# Patient Record
Sex: Female | Born: 1965 | Race: White | Hispanic: Yes | Marital: Single | Smoking: Never smoker
Health system: Southern US, Community
[De-identification: ages and names within clinical notes are randomized; demographics above are authoritative.]

## PROBLEM LIST (undated history)

## (undated) DIAGNOSIS — E7800 Pure hypercholesterolemia, unspecified: Secondary | ICD-10-CM

## (undated) HISTORY — DX: Pure hypercholesterolemia, unspecified: E78.00

---

## 2004-10-03 ENCOUNTER — Emergency Department (HOSPITAL_COMMUNITY): Admission: EM | Admit: 2004-10-03 | Discharge: 2004-10-03 | Payer: Self-pay | Admitting: Family Medicine

## 2004-11-20 ENCOUNTER — Emergency Department (HOSPITAL_COMMUNITY): Admission: EM | Admit: 2004-11-20 | Discharge: 2004-11-20 | Payer: Self-pay | Admitting: Family Medicine

## 2005-05-17 ENCOUNTER — Emergency Department (HOSPITAL_COMMUNITY): Admission: EM | Admit: 2005-05-17 | Discharge: 2005-05-17 | Payer: Self-pay | Admitting: Emergency Medicine

## 2005-11-17 ENCOUNTER — Emergency Department (HOSPITAL_COMMUNITY): Admission: EM | Admit: 2005-11-17 | Discharge: 2005-11-18 | Payer: Self-pay | Admitting: Emergency Medicine

## 2005-12-17 ENCOUNTER — Emergency Department (HOSPITAL_COMMUNITY): Admission: AD | Admit: 2005-12-17 | Discharge: 2005-12-17 | Payer: Self-pay | Admitting: Family Medicine

## 2005-12-27 ENCOUNTER — Ambulatory Visit (HOSPITAL_COMMUNITY): Admission: RE | Admit: 2005-12-27 | Discharge: 2005-12-27 | Payer: Self-pay | Admitting: Obstetrics & Gynecology

## 2006-06-14 ENCOUNTER — Emergency Department (HOSPITAL_COMMUNITY): Admission: EM | Admit: 2006-06-14 | Discharge: 2006-06-14 | Payer: Self-pay | Admitting: Family Medicine

## 2007-12-04 ENCOUNTER — Other Ambulatory Visit: Admission: RE | Admit: 2007-12-04 | Discharge: 2007-12-04 | Payer: Self-pay | Admitting: Gynecology

## 2009-03-22 ENCOUNTER — Encounter: Payer: Self-pay | Admitting: Gynecology

## 2009-03-22 ENCOUNTER — Other Ambulatory Visit: Admission: RE | Admit: 2009-03-22 | Discharge: 2009-03-22 | Payer: Self-pay | Admitting: Gynecology

## 2009-03-22 ENCOUNTER — Ambulatory Visit: Payer: Self-pay | Admitting: Gynecology

## 2009-03-26 ENCOUNTER — Inpatient Hospital Stay (HOSPITAL_COMMUNITY): Admission: AD | Admit: 2009-03-26 | Discharge: 2009-03-26 | Payer: Self-pay | Admitting: Gynecology

## 2009-06-09 ENCOUNTER — Ambulatory Visit (HOSPITAL_COMMUNITY): Admission: RE | Admit: 2009-06-09 | Discharge: 2009-06-09 | Payer: Self-pay | Admitting: Obstetrics and Gynecology

## 2009-07-08 ENCOUNTER — Emergency Department (HOSPITAL_COMMUNITY): Admission: EM | Admit: 2009-07-08 | Discharge: 2009-07-08 | Payer: Self-pay | Admitting: Family Medicine

## 2010-10-02 ENCOUNTER — Encounter: Payer: Self-pay | Admitting: Obstetrics and Gynecology

## 2010-12-15 LAB — POCT URINALYSIS DIP (DEVICE)
Bilirubin Urine: NEGATIVE
Nitrite: NEGATIVE
Urobilinogen, UA: 1 mg/dL (ref 0.0–1.0)

## 2010-12-18 LAB — WET PREP, GENITAL: Yeast Wet Prep HPF POC: NONE SEEN

## 2010-12-18 LAB — GC/CHLAMYDIA PROBE AMP, GENITAL: GC Probe Amp, Genital: NEGATIVE

## 2011-10-19 ENCOUNTER — Other Ambulatory Visit (HOSPITAL_COMMUNITY)
Admission: RE | Admit: 2011-10-19 | Discharge: 2011-10-19 | Disposition: A | Discharge status: Home / Self Care | Payer: 59 | Source: Ambulatory Visit | Attending: Gynecology | Admitting: Gynecology

## 2011-10-19 ENCOUNTER — Other Ambulatory Visit: Payer: Self-pay | Admitting: Gynecology

## 2011-10-19 ENCOUNTER — Other Ambulatory Visit: Payer: Self-pay | Admitting: *Deleted

## 2011-10-19 ENCOUNTER — Encounter: Payer: Self-pay | Admitting: Gynecology

## 2011-10-19 ENCOUNTER — Ambulatory Visit (INDEPENDENT_AMBULATORY_CARE_PROVIDER_SITE_OTHER): Payer: 59 | Admitting: Gynecology

## 2011-10-19 DIAGNOSIS — N898 Other specified noninflammatory disorders of vagina: Secondary | ICD-10-CM

## 2011-10-19 DIAGNOSIS — M6289 Other specified disorders of muscle: Secondary | ICD-10-CM

## 2011-10-19 DIAGNOSIS — M2550 Pain in unspecified joint: Secondary | ICD-10-CM

## 2011-10-19 DIAGNOSIS — R29898 Other symptoms and signs involving the musculoskeletal system: Secondary | ICD-10-CM

## 2011-10-19 DIAGNOSIS — Z113 Encounter for screening for infections with a predominantly sexual mode of transmission: Secondary | ICD-10-CM

## 2011-10-19 DIAGNOSIS — Z01419 Encounter for gynecological examination (general) (routine) without abnormal findings: Secondary | ICD-10-CM

## 2011-10-19 DIAGNOSIS — R635 Abnormal weight gain: Secondary | ICD-10-CM

## 2011-10-19 LAB — URINALYSIS W MICROSCOPIC + REFLEX CULTURE
Casts: NONE SEEN
Crystals: NONE SEEN
Urobilinogen, UA: 0.2 mg/dL (ref 0.0–1.0)

## 2011-10-19 LAB — LIPID PANEL
HDL: 44 mg/dL (ref >39)
LDL Cholesterol: 129 mg/dL — ABNORMAL HIGH (ref 0–99)
VLDL: 20 mg/dL (ref 0–40)

## 2011-10-19 LAB — WET PREP FOR TRICH, YEAST, CLUE: Clue Cells Wet Prep HPF POC: NONE SEEN

## 2011-10-19 MED ORDER — NITROFURANTOIN MONOHYD MACRO 100 MG PO CAPS: 100.0000 mg | ORAL_CAPSULE | Freq: Two times a day (BID) | ORAL | Status: AC

## 2011-10-19 NOTE — Progress Notes (Signed)
Sharon Blair 05/05/1966 [id number]   History:    46 y.o.  for annual exam with request also to have an STD screen since she has a new partner. She thought she was having a slight vaginal discharge. Has complained of weight gain. And also musculoskeletal fatigue. Patient had a Mirena IUD placed after the birth of her last child in 2011.  Past medical history,surgical history, family history and social history were all reviewed and documented in the EPIC chart.  Gynecologic History No LMP recorded. Patient is not currently having periods (Reason: IUD). Contraception: IUD Last Pap: 2011. Results were: normal Last mammogram: Several years ago. Results were: normal  Obstetric History OB History    Grav Para Term Preterm Abortions TAB SAB Ect Mult Living   6 4 4  1  1   4      # Outc Date GA Lbr Len/2nd Wgt Sex Del Anes PTL Lv   1 GRA            2 TRM     F SVD  No Yes   3 TRM     M SVD  No Yes   4 TRM     F SVD  No Yes   5 TRM     F SVD  No Yes   6 SAB                ROS:  Was performed and pertinent positives and negatives are included in the history.  Exam: chaperone present  BP 122/78  Ht 4' 11.5" (1.511 m)  Wt 186 lb (84.369 kg)  BMI 36.94 kg/m2  Body mass index is 36.94 kg/(m^2).  General appearance : Well developed well nourished female. No acute distress HEENT: Neck supple, trachea midline, no carotid bruits, no thyroidmegaly Lungs: Clear to auscultation, no rhonchi or wheezes, or rib retractions  Heart: Regular rate and rhythm, no murmurs or gallops Breast:Examined in sitting and supine position were symmetrical in appearance, no palpable masses or tenderness,  no skin retraction, no nipple inversion, no nipple discharge, no skin discoloration, no axillary or supraclavicular lymphadenopathy Abdomen: no palpable masses or tenderness, no rebound or guarding Extremities: no edema or skin discoloration or tenderness  Pelvic:  Bartholin, Urethra, Skene Glands:  Within normal limits             Vagina: No gross lesions or discharge  Cervix: No gross lesions or discharge, IUD string seen  Uterus  anteverted, normal size, shape and consistency, non-tender and mobile  Adnexa  Without masses or tenderness  Anus and perineum  normal   Rectovaginal  normal sphincter tone without palpated masses or tenderness             Hemoccult not done     Assessment/Plan:  46 y.o. female for annual exam with a multitude of complaints today. Her wet prep was essentially negative we did do a GC and Chlamydia culture as part of the STD screen as well as an HIV RPR hepatitis B and C. She was given a requisition to schedule her mammogram. We'll be checking a fasting lipid profile, fasting blood sugar, TSH, urinalysis, CBC, and vitamin D as well as her Pap smear. Will notify with results become available if there is any abnormality. She was encouraged to do her monthly self breast examination. She was instructed take calcium and vitamin D for osteoporosis prevention. And she was encouraged to engage in weightbearing exercises 3 or 4 times a week. Literature information  on weight reduction diet was provided as well as on exercise.    Ok Edwards MD, 12:46 PM 10/19/2011

## 2011-10-19 NOTE — Patient Instructions (Signed)
Control del colesterol  Los niveles de colesterol en el organismo estn determinados significativamente por su dieta. Los niveles de colesterol tambin se relacionan con la enfermedad cardaca. El material que sigue ayuda a explicar esta relacin y a analizar qu puede hacer para mantener su corazn sano. No todo el colesterol es malo. Las lipoprotenas de baja densidad (LDL) forman el colesterol "malo". El colesterol malo puede ocasionar depsitos de grasa que se acumulan en el interior de las arterias. Las lipoprotenas de alta densidad (HDL) es el colesterol "bueno". Ayuda a remover el colesterol LDL "malo" de la sangre. El colesterol es un factor de riesgo muy importante para la enfermedad cardaca. Otros factores de riesgo son la hipertensin arterial, el hbito de fumar, el estrs, la herencia y el peso.   El msculo cardaco obtiene el suministro de sangre a travs de las arterias coronarias. Si su colesterol LDL ("malo") est elevado y el HDL ("bueno") es bajo, tiene un factor de riesgo para que se formen depsitos de grasa en las arterias coronarias (los vasos sanguneos que suministran sangre al corazn). Esto hace que haya menos lugar para que la sangre circule. Sin la suficiente sangre y oxgeno, el msculo cardaco no puede funcionar correctamente, y usted podr sentir dolores en el pecho (angina pectoris). Cuando una arteria coronaria se cierra completamente, una parte del msculo cardaco puede morir (infarto de miocardio).  CONTROL DEL COLESTEROL Cuando el profesional que lo asiste enva la sangre al laboratorio para conocer el nivel de colesterol, puede realizarle tambin un perfil completo de los lpidos. Con esta prueba, se puede determinar la cantidad total de colesterol, as como los niveles de LDL y HDL. Los triglicridos son un tipo de grasa que circula en la sangre y que tambin puede utilizarse para determinar el riesgo de enfermedad  cardaca. En la siguiente tabla se establecen los nmeros ideales: Prueba: Colesterol total  Menos de 200 mg/dl.  Prueba: LDL "colesterol malo"  Menos de 100 mg/dl.   Menos de 70 mg/dl si tiene riesgo muy elevado de sufrir un ataque cardaco o muerte cardaca sbita.  Prueba: HDL "colesterol bueno"  Mujeres: Ms de 50 mg/dl.   Hombres: Ms de 40 mg/dl.  Prueba: Trigliceridos  Menos de 150 mg/dl.    CONTROL DEL COLESTEROL CON DIETA Aunque factores como el ejercicio y el estilo de vida son importantes, la "primera lnea de ataque" es la dieta. Esto se debe a que se sabe que ciertos alimentos hacen subir el colesterol y otros lo bajan. El objetivo debe ser equilibrar los alimentos, de modo que tengan un efecto sobre el colesterol y, an ms importante, reemplazar las grasas saturadas y trans con otros tipos de grasas, como las monoinsaturadas y las poliinsaturadas y cidos grasos omega-3 . En promedio, una persona no debe consumir ms de 15 a 17 g de grasas saturadas por da. Las grasas saturadas y trans se consideran grasas "malas", ya que elevan el colesterol LDL. Las grasas saturadas se encuentran principalmente en productos animales como carne, manteca y crema. Pero esto no significa que usted debe sacrificar todas sus comidas favoritas. Actualmente, como lo muestra el cuadro que figura al final de este documento, hay sustitutos de buen sabor, bajos en grasas y en colesterol, para la mayora de los alimentos que a usted le gusta comer. Elija aquellos alimentos alternativos que sean bajos en grasas o sin grasas. Elija cortes de carne del cuarto trasero o lomo ya que estos cortes son los que tienen menor cantidad de grasa   y colesterol. El pollo (sin piel), el pescado, la carne de ternera, y la pechuga de pavo molida son excelentes opciones. Elimine las carnes grasosas como los hotdogs o el salami. Los mariscos tienen poco o nada de grasas saturadas. Cuando consuma carne magra, carne de aves de  corral, o pescado, hgalo en porciones de 85 gramos (3 onzas). Las grasas trans tambin se llaman "aceites parcialmente hidrogenados". Son aceites manipulados cientficamente de modo que son slidos a temperatura ambiente, tienen una larga vida y mejoran el sabor y la textura de los alimentos a los que se agregan. Las grasas trans se encuentran en la margarina, masitas, crackers y alimentos horneados.  Para hornear y cocinar, el aceite es un excelente sustituto para la mantequilla. Los aceites monoinsaturados tienen un beneficio particular, ya que se cree que disminuyen el colesterol LDL (colesterol malo) y elevan el HDL. Deber evitar los aceites tropicales saturados como el de coco y el de palma.  Recuerde, adems, que puede comer sin restricciones los grupos de alimentos que son naturalmente libres de grasas saturadas y grasas trans, entre los que se incluyen el pescado, las frutas (excepto el aguacate), verduras, frijoles, cereales (cebada, arroz, cuzcuz, trigo) y las pastas (sin salsas con crema)   IDENTIFIQUE LOS ALIMENTOS QUE DISMINUYEN EL COLESTEROL  Pueden disminuir el colesterol las fibras solubles que estn en las frutas, como las manzanas, en los vegetales como el brcoli, las patatas y las zanahorias; en las legumbres como frijoles, guisantes y lentejas; y en los cereales como la cebada. Los alimentos fortificados con fitosteroles tambin pueden disminuir el colesterol. Debe consumir al menos 2 g de estos alimentos a diario para obtener el efecto de disminucin de colesterol.  En el supermercado, lea las etiquetas de los envases para identificar los alimentos bajos en grasas saturadas, libres de grasas trans y bajos en grasas, . Elija quesos que tengan solo de 2 a 3 g de grasa saturada por onza (28,35 g). Use una margarina que no dae el corazn, libre de grasas trans o aceite parcialmente hidrogenado. Al comprar alimentos horneados (galletitas dulces y galletas) evite el aceite parcialmente  hidrogenado. Los panes y bollos debern ser de granos enteros (harina de maz o de avena entera, en lugar de "harina" o "harina enriquecida"). Compre sopas en lata que no sean cremosas, con bajo contenido de sal y sin grasas adicionadas.   TCNICAS DE PREPARACIN DE LOS ALIMENTOS  Nunca fra los alimentos en aceite abundante. Si debe frer, hgalo en poco aceite y removiendo constantemente, porque as se utilizan muy pocas grasas, o utilice un spray antiadherente. Cuando le sea posible, hierva, hornee o ase las carnes y cocine los vegetales al vapor. En vez de aderezar los vegetales con mantequilla o margarina, utilice limn y hierbas, pur de manzanas y canela (para las calabazas y batatas), yogurt y salsa descremados y aderezos para ensaladas bajos en contenido graso.   BAJO EN GRASAS SATURADAS / SUSTITUTOS BAJOS EN GRASA  Carnes / Grasas saturadas (g)  Evite: Bife, corte graso (3 oz/85 g) / 11 g   Elija: Bife, corte magro (3 oz/85 g) / 4 g   Evite: Hamburguesa (3 oz/85 g) / 7 g   Elija:  [place]amburguesa magra (3 oz/85 g) / 5 g   Evite: Jamn (3 oz/85 g) / 6 g   Elija:  Jamn magro (3 oz/85 g) / 2.4 g   Evite: Pollo, con piel (3 oz/85 g), Carne oscura / 4 g   Elija:  Pollo, sin piel (  3 oz/85 g), Carne oscura / 2 g   Evite: Pollo, con piel (3 oz/85 g), Carne magra / 2.5 g   Elija: Pollo, sin piel (3 oz/85 g), Carne magra / 1 g  Lcteos / Grasas saturadas (g)  Evite: Leche entera (1 taza) / 5 g   Elija: Leche con bajo contenido de grasa, 2% (1 taza) / 3 g   Elija: Leche con bajo contenido de grasa, 1% (1 taza) / 1.5 g   Elija: Leche descremada (1 taza) / 0.3 g   Evite: Queso duro (1 oz/28 g) / 6 g   Elija: Queso descremado (1 oz/28 g) / 2-3 g   Evite: Queso cottage, 4% grasa (1 taza)/ 6.5 g   Elija: Queso cottage con bajo contenido de grasa, 1% grasa (1 taza)/ 1.5 g   Evite: Helado (1 taza) / 9 g   Elija: Sorbete (1 taza) / 2.5 g   Elija: Yogurt helado sin contenido de  grasa (1 taza) / 0.3 g   Elija: Barras de fruta congeladas / vestigios   Evite: Crema batida (1 cucharada) / 3.5 g   Elija: Batidos glac sin lcteos (1 cucharada) / 1 g  Condimentos / Grasas saturadas (g)  Evite: Mayonesa (1 cucharada) / 2 g   Elija: Mayonesa con bajo contenido de grasa (1 cucharada) / 1 g   Evite: Manteca (1 cucharada) / 7 g   Elija: Margarina extra light (1 cucharada) / 1 g   Evite: Aceite de coco (1 cucharada) / 11.8 g   Elija: Aceite de oliva (1 cucharada) / 1.8 g   Elija: Aceite de maz (1 cucharada) / 1.7 g   Elija: Aceite de crtamo (1 cucharada) / 1.2 g   Elija: Aceite de girasol (1 cucharada) / 1.4 g   Elija: Aceite de soja (1 cucharada) / 2.4 g   Elija: Aceite de canola (1 cucharada) / 1 g  Document Released: 08/28/2005 Document Revised: 05/10/2011 [employer] Patient Information 2012 [employer]. Ejercicios para perder peso (Exercise to Lose Weight) La actividad fsica y una dieta saludable ayudan a perder peso. El mdico podr sugerirle ejercicios especficos. IDEAS Y CONSEJOS PARA HACER EJERCICIOS  Elija opciones econmicas que disfrute hacer , como caminar, andar en bicicleta o los vdeos para ejercitarse.   Utilice las escaleras en lugar del ascensor.   Camine durante la hora del almuerzo.   Estacione el auto lejos del lugar de trabajo o estudio.   Concurra a un gimnasio o tome clases de gimnasia.   Comience con 5  10 minutos de actividad fsica por da. Ejercite hasta 30 minutos, 4 a 6 das por semana.   Utilice zapatos que tengan un buen soporte y ropas cmodas.   Elongue antes y despus de ejercitar.   Ejercite hasta que aumente la respiracin y el corazn palpite rpido.   Beba agua extra cuando ejercite.   No haga ejercicio hasta lastimarse, sentirse mareado o que le falte mucho el aire.  La actividad fsica puede quemar alrededor de 150 caloras.  Correr 20 cuadras en 15 minutos.   Jugar vley durante 45 a 60  minutos.   Limpiar y encerar el auto durante 45 a 60 minutos.   Jugar ftbol americano de toque.   Caminar 25 cuadras en 35 minutos.   Empujar un cochecito 20 cuadras en 30 minutos.   Jugar baloncesto durante 30 minutos.   Rastrillar hojas secas durante 30 minutos.   Andar en bicicleta 80 cuadras en 30 minutos.     Caminar 30 cuadras en 30 minutos.   Bailar durante 30 minutos.   Quitar la nieve con una pala durante 15 minutos.   Nadar vigorosamente durante 20 minutos.   Subir escaleras durante 15 minutos.   Andar en bicicleta 60 cuadras durante 15 minutos.   Arreglar el jardn entre 30 y 45 minutos.   Saltar a la soga durante 15 minutos.   Limpiar vidrios o pisos durante 45 a 60 minutos.  Document Released: 12/02/2010 Document Revised: 05/10/2011 [employer] Patient Information 2012 [employer]. 

## 2011-10-20 ENCOUNTER — Other Ambulatory Visit: Payer: Self-pay | Admitting: *Deleted

## 2011-10-20 DIAGNOSIS — E559 Vitamin D deficiency, unspecified: Secondary | ICD-10-CM

## 2011-10-20 LAB — CBC WITH DIFFERENTIAL/PLATELET
Eosinophils Relative: 2 % (ref 0–5)
HCT: 42.2 % (ref 36.0–46.0)
Lymphocytes Relative: 33 % (ref 12–46)
MCV: 97 fL (ref 78.0–100.0)
Neutro Abs: 4.1 10*3/uL (ref 1.7–7.7)
Neutrophils Relative %: 60 % (ref 43–77)
RBC: 4.35 MIL/uL (ref 3.87–5.11)
RDW: 13.1 % (ref 11.5–15.5)
WBC: 6.9 10*3/uL (ref 4.0–10.5)

## 2011-10-20 LAB — HEPATITIS C ANTIBODY: HCV Ab: NEGATIVE

## 2011-10-20 LAB — HIV ANTIBODY (ROUTINE TESTING W REFLEX): HIV: NONREACTIVE

## 2011-10-20 MED ORDER — VITAMIN D (ERGOCALCIFEROL) 1.25 MG (50000 UNIT) PO CAPS: 50000.0000 [IU] | ORAL_CAPSULE | ORAL | Status: DC

## 2012-10-06 ENCOUNTER — Encounter (HOSPITAL_COMMUNITY): Payer: Self-pay | Admitting: *Deleted

## 2012-10-06 ENCOUNTER — Other Ambulatory Visit (HOSPITAL_COMMUNITY)
Admission: RE | Admit: 2012-10-06 | Discharge: 2012-10-06 | Disposition: A | Discharge status: Home / Self Care | Payer: 59 | Source: Ambulatory Visit | Attending: Emergency Medicine | Admitting: Emergency Medicine

## 2012-10-06 ENCOUNTER — Emergency Department (HOSPITAL_COMMUNITY)
Admission: EM | Admit: 2012-10-06 | Discharge: 2012-10-06 | Disposition: A | Discharge status: Home / Self Care | Payer: 59 | Source: Home / Self Care

## 2012-10-06 DIAGNOSIS — Z113 Encounter for screening for infections with a predominantly sexual mode of transmission: Secondary | ICD-10-CM | POA: Insufficient documentation

## 2012-10-06 DIAGNOSIS — N760 Acute vaginitis: Secondary | ICD-10-CM | POA: Insufficient documentation

## 2012-10-06 DIAGNOSIS — A499 Bacterial infection, unspecified: Secondary | ICD-10-CM

## 2012-10-06 DIAGNOSIS — R05 Cough: Secondary | ICD-10-CM

## 2012-10-06 DIAGNOSIS — B9689 Other specified bacterial agents as the cause of diseases classified elsewhere: Secondary | ICD-10-CM

## 2012-10-06 DIAGNOSIS — B002 Herpesviral gingivostomatitis and pharyngotonsillitis: Secondary | ICD-10-CM

## 2012-10-06 LAB — POCT PREGNANCY, URINE: Preg Test, Ur: NEGATIVE

## 2012-10-06 MED ORDER — GUAIFENESIN-CODEINE 100-10 MG/5ML PO SYRP: ORAL_SOLUTION | ORAL | Status: DC

## 2012-10-06 MED ORDER — PENCICLOVIR 1 % EX CREA: TOPICAL_CREAM | CUTANEOUS | Status: DC

## 2012-10-06 MED ORDER — METRONIDAZOLE 500 MG PO TABS: 500.0000 mg | ORAL_TABLET | Freq: Two times a day (BID) | ORAL | Status: DC

## 2012-10-06 NOTE — ED Provider Notes (Signed)
History     CSN: [id number]  Arrival date & time 10/06/12  1159   None     Chief Complaint  Patient presents with  . Vaginal Discharge  . Cough  . Oral Swelling    (Consider location/radiation/quality/duration/timing/severity/associated sxs/prior treatment) HPI Comments: 47-year-old Hispanic female who speaks English is complaining of a vaginal discharge with itching for several days. She also states she has a rash at her by inguinal creases. Is also complaining of bumps and dryness to the lips. Is also complaining of dry cough for one week. She denies earache sore throat or fever. She occasionally has shortness of breath with walking.   Past Medical History  Diagnosis Date  . High cholesterol     History reviewed. No pertinent past surgical history.  Family History  Problem Relation Age of Onset  . Hypertension Mother   . Osteoporosis Mother   . Diabetes Paternal Grandfather     History  Substance Use Topics  . Smoking status: Never Smoker   . Smokeless tobacco: Never Used  . Alcohol Use: Yes     Comment: SOCIAL    OB History    Grav Para Term Preterm Abortions TAB SAB Ect Mult Living   6 4 4  1  1   4       Review of Systems  Constitutional: Negative for fever, chills, diaphoresis, activity change and fatigue.  HENT: Negative.   Respiratory: Negative.  Negative for chest tightness, wheezing and stridor.   Cardiovascular: Negative.  Negative for chest pain, palpitations and leg swelling.  Gastrointestinal: Negative.   Genitourinary: Positive for vaginal discharge. Negative for dysuria, frequency, vaginal bleeding and vaginal pain.  Musculoskeletal: Negative.   Skin: Positive for rash.  Neurological: Negative.   Psychiatric/Behavioral: Negative.     Allergies  Monurol  Home Medications   Current Outpatient Rx  Name  Route  Sig  Dispense  Refill  . GUAIFENESIN-CODEINE 100-10 MG/5ML PO SYRP      1 to 2 teaspoons po q 4 hours prn cough   120 mL   0   . METRONIDAZOLE 500 MG PO TABS   Oral   Take 1 tablet (500 mg total) by mouth 2 (two) times daily. X 7 days   14 tablet   0   . PENCICLOVIR 1 % EX CREA   Topical   Apply topically every 2 (two) hours. Apply to lips q 2 hours while awake for 2 days.   1.5 g   0   . VITAMIN D (ERGOCALCIFEROL) 50000 UNITS PO CAPS   Oral   Take 1 capsule (50,000 Units total) by mouth every 7 (seven) days.   12 capsule   0     BP 128/90  Pulse 71  Temp 98.1 F (36.7 C) (Oral)  Resp 18  SpO2 100%  Physical Exam  Nursing note and vitals reviewed. Constitutional: She is oriented to person, place, and time. She appears well-developed and well-nourished. No distress.  HENT:  Head: Normocephalic and atraumatic.  Mouth/Throat: Oropharynx is clear and moist. No oropharyngeal exudate.       Positive for clear PND The upper and lower lips are dry and chapped however, they also contained small tender vesicles consistent with herpes simplex 1.  Eyes: EOM are normal. Pupils are equal, round, and reactive to light.  Neck: Normal range of motion. Neck supple.  Cardiovascular: Normal rate and normal heart sounds.   Pulmonary/Chest: Effort normal and breath sounds normal. No respiratory distress. She  has no wheezes. She has no rales.  Abdominal: Soft. There is no tenderness.  Genitourinary:       Normal external female genitalia with no lesions however there is a vaginal discharge in the introitus. The vagina admits speculum easily. The vaginal walls and cervix are coated with thin and pale yellow to green bubbly discharge. The cervix is midline and posterior. There is erythema to portions of the ectocervix. The os is friable. No lesions are observed on the cervix or within the vagina. No CMT or adnexal tenderness.  Musculoskeletal: Normal range of motion.  Lymphadenopathy:    She has no cervical adenopathy.  Neurological: She is alert and oriented to person, place, and time. No cranial nerve deficit.    Skin: Skin is warm and dry.  Psychiatric: She has a normal mood and affect.    ED Course  Procedures (including critical care time)   Labs Reviewed  POCT PREGNANCY, URINE  CERVICOVAGINAL ANCILLARY ONLY   No results found.   1. Oral herpes simplex infection   2. Vaginitis   3. BV (bacterial vaginosis)   4. Cough       MDM  Patient has a mild URI with cough due to PND. She is afebrile and stable. Cheratussin 1-2 teaspoons every 4 hours as needed for cough Drink plenty of fluids and stay well-hydrated Denavir cream applied to the upper lower lips every 2 hours while awake for 2 days Metronidazole 500 mg twice a day for 7 days. affirm test were obtained. Results are pending and upon return for any positives will be called and treated accordingly.          Hayden Rasmussen, NP 10/06/12 1521

## 2012-10-06 NOTE — ED Notes (Signed)
C/O vaginal discharge and pruritis x 1 wk without pain; c/o productive cough x 1 wk without fever.  3 days ago started with dry, cracked, sore lips.  Has been taking Alka Seltzer and applying Vaseline to lips.

## 2012-10-06 NOTE — ED Provider Notes (Signed)
Medical screening examination/treatment/procedure(s) were performed by resident physician or non-physician practitioner and as supervising physician I was immediately available for consultation/collaboration.   KINDL,JAMES DOUGLAS MD.    James D Kindl, MD 10/06/12 1633 

## 2012-10-08 NOTE — ED Notes (Signed)
GC/Chlamydia/Trich neg., Affirm: Candida neg.,  Gardnerella pos.  Pt. adequately treated with Flagyl.   Sharon Blair 10/08/2012

## 2012-11-08 ENCOUNTER — Ambulatory Visit (INDEPENDENT_AMBULATORY_CARE_PROVIDER_SITE_OTHER): Payer: 59 | Admitting: Gynecology

## 2012-11-08 ENCOUNTER — Encounter: Payer: Self-pay | Admitting: Gynecology

## 2012-11-08 ENCOUNTER — Telehealth: Payer: Self-pay | Admitting: *Deleted

## 2012-11-08 ENCOUNTER — Telehealth: Payer: Self-pay | Admitting: Gynecology

## 2012-11-08 VITALS — BP 124/78 | Ht 59.25 in | Wt 183.0 lb

## 2012-11-08 DIAGNOSIS — Z01419 Encounter for gynecological examination (general) (routine) without abnormal findings: Secondary | ICD-10-CM

## 2012-11-08 DIAGNOSIS — L659 Nonscarring hair loss, unspecified: Secondary | ICD-10-CM

## 2012-11-08 DIAGNOSIS — R635 Abnormal weight gain: Secondary | ICD-10-CM | POA: Insufficient documentation

## 2012-11-08 DIAGNOSIS — Z8639 Personal history of other endocrine, nutritional and metabolic disease: Secondary | ICD-10-CM | POA: Insufficient documentation

## 2012-11-08 DIAGNOSIS — N898 Other specified noninflammatory disorders of vagina: Secondary | ICD-10-CM

## 2012-11-08 LAB — COMPREHENSIVE METABOLIC PANEL
Albumin: 4.2 g/dL (ref 3.5–5.2)
Alkaline Phosphatase: 61 U/L (ref 39–117)
BUN: 10 mg/dL (ref 6–23)
Chloride: 104 mEq/L (ref 96–112)
Creat: 0.55 mg/dL (ref 0.50–1.10)
Glucose, Bld: 81 mg/dL (ref 70–99)
Sodium: 139 mEq/L (ref 135–145)

## 2012-11-08 LAB — CBC WITH DIFFERENTIAL/PLATELET
Basophils Relative: 0 % (ref 0–1)
Lymphocytes Relative: 31 % (ref 12–46)
MCV: 93.7 fL (ref 78.0–100.0)
Monocytes Absolute: 0.3 10*3/uL (ref 0.1–1.0)
Monocytes Relative: 5 % (ref 3–12)
Neutrophils Relative %: 62 % (ref 43–77)
Platelets: 252 10*3/uL (ref 150–400)
WBC: 5.3 10*3/uL (ref 4.0–10.5)

## 2012-11-08 LAB — TSH: TSH: 1.073 u[IU]/mL (ref 0.350–4.500)

## 2012-11-08 LAB — LIPID PANEL
Cholesterol: 215 mg/dL — ABNORMAL HIGH (ref 0–200)
LDL Cholesterol: 148 mg/dL — ABNORMAL HIGH (ref 0–99)
Triglycerides: 109 mg/dL (ref <150)
VLDL: 22 mg/dL (ref 0–40)

## 2012-11-08 LAB — WET PREP FOR TRICH, YEAST, CLUE: Trich, Wet Prep: NONE SEEN

## 2012-11-08 MED ORDER — CLINDAMYCIN PHOSPHATE 2 % VA CREA: TOPICAL_CREAM | VAGINAL | Status: DC

## 2012-11-08 NOTE — Progress Notes (Addendum)
Sharon Blair Nov 20, 1965 [id number]   History:    47 y.o.  for annual gyn exam with complaint of scattered hair loss and weight gain. Review of patient's record demonstrated that she has had history vitamin D deficiency in the past. Patient's last mammogram was in 2007. Patient states she monthly does her self breast exam. She has informed me that her Tdap and flu vaccine are up-to-date. She has a Mirena IUD that was placed in 2011 and  her cycles are very light and regular.  Past medical history,surgical history, family history and social history were all reviewed and documented in the EPIC chart.  Gynecologic History No LMP recorded. Patient is not currently having periods (Reason: IUD). Contraception: IUD Last Pap: 2014. Results were: normal Last mammogram: 2007. Results were: normal  Obstetric History OB History   Grav Para Term Preterm Abortions TAB SAB Ect Mult Living   6 4 4  1  1   4      # Outc Date GA Lbr Len/2nd Wgt Sex Del Anes PTL Lv   1 GRA            2 TRM     F SVD  No Yes   3 TRM     M SVD  No Yes   4 TRM     F SVD  No Yes   5 TRM     F SVD  No Yes   6 SAB                ROS: A ROS was performed and pertinent positives and negatives are included in the history.  GENERAL: No fevers or chills. HEENT: No change in vision, no earache, sore throat or sinus congestion. NECK: No pain or stiffness. CARDIOVASCULAR: No chest pain or pressure. No palpitations. PULMONARY: No shortness of breath, cough or wheeze. GASTROINTESTINAL: No abdominal pain, nausea, vomiting or diarrhea, melena or bright red blood per rectum. GENITOURINARY: No urinary frequency, urgency, hesitancy or dysuria. MUSCULOSKELETAL: No joint or muscle pain, no back pain, no recent trauma. DERMATOLOGIC:hair loss ENDOCRINE: No polyuria, polydipsia, no heat or cold intolerance. No recent change in weight. HEMATOLOGICAL: No anemia or easy bruising or bleeding. NEUROLOGIC: No headache, seizures, numbness, tingling or  weakness. PSYCHIATRIC: No depression, no loss of interest in normal activity or change in sleep pattern.     Exam: chaperone present  BP 124/78  Ht 4' 11.25" (1.505 m)  Wt 183 lb (83.008 kg)  BMI 36.65 kg/m2  Body mass index is 36.65 kg/(m^2).  General appearance : Well developed well nourished female. No acute distress HEENT: Neck supple, trachea midline, no carotid bruits, no thyroidmegaly Lungs: Clear to auscultation, no rhonchi or wheezes, or rib retractions  Heart: Regular rate and rhythm, no murmurs or gallops Breast:Examined in sitting and supine position were symmetrical in appearance, no palpable masses or tenderness,  no skin retraction, no nipple inversion, no nipple discharge, no skin discoloration, no axillary or supraclavicular lymphadenopathy Abdomen: no palpable masses or tenderness, no rebound or guarding Extremities: no edema or skin discoloration or tenderness  Pelvic:  Bartholin, Urethra, Skene Glands: Within normal limits             Vagina: No gross lesions or discharge, white discharge was noted  Cervix: No gross lesions or discharge  Uterus  anteverted, normal size, shape and consistency, non-tender and mobile  Adnexa  Without masses or tenderness  Anus and perineum  normal   Rectovaginal  normal sphincter tone  without palpated masses or tenderness             Hemoccult not done   Wet prep: Negative a few white blood cells moderate bacteria  Assessment/Plan:  47 y.o. female for annual exam  She is complaining of weight gain and some slight hair loss. She has informed me that before she was with the Mirena IUD that her cycles are regular. We discussed about switching her to a nonhormonal IUD such as the ParaGard T380A. The following labs will be ordered: TSH, total testosterone, fasting lipid profile, comprehensive metabolic panel, urinalysis and vitamin D. No Pap smear done today the new guidelines discussed. She was given a requisition to schedule her  mammogram. We discussed importance of monthly self breast examination. Since we are planning on changing her IUD sometime next week with the moderate amount of bacteria seen on wet prep I'm going to call her in a prescription for Cleocin vaginal cream to apply each bedtime for 5 days.    Ok Edwards MD, 11:24 AM 11/08/2012

## 2012-11-08 NOTE — Progress Notes (Signed)
No menses due to Mirena IUD

## 2012-11-08 NOTE — Telephone Encounter (Signed)
Pt was advised today that her insurance covers the Paraguard IUD and its insertion as well as the removal of her Mirena IUD at 100% under preventative services. She will advise if she wants to proceed.She is not getting periods with the Mirena so we can insert when she wants to schedule it/[facility]

## 2012-11-08 NOTE — Telephone Encounter (Signed)
Message copied by Aura Camps on Fri Nov 08, 2012 12:16 PM ------      Message from: Ok Edwards      Created: Fri Nov 08, 2012 11:30 AM       Please call patient and inform her that she had left the office when I received the results of her wet prep. Because of the bacteria present and since we are going to change her IUD soon I would like for you to call in a prescription for Cleocin vaginal cream to apply each bedtime for 5 days. ------

## 2012-11-08 NOTE — Patient Instructions (Addendum)
Informacin sobre el dispositivo intrauterino  (Intrauterine Device Information) El dispositivo intrauterino (DIU) se inserta en el tero e impide el embarazo. Hay dos tipos de DIU:   DIU de cobre. Este tipo de DIU est recubierto con un alambre de cobre y se inserta dentro del tero. El cobre hace que el tero y las trompas de Falopio produzcan un liquido que destruye los espermatozoides. El DIU de cobre puede permanecer en el lugar durante 10 aos.  DIU hormonal. Este tipo de DIU contiene la hormona progestina (progesterona sinttica). La hormona espesa el moco cervical y evita que los espermatozoides ingresen al tero y tambin afina la membrana que cubre el tero para evitar la implantacin del vulo fertilizado. La hormona debilita o destruye los espermatozoides que ingresan al tero. El DIU hormonal puede permanecer en el lugar durante 5 aos. El mdico se asegurar de que usted es una buena candidata para usar el DIU cono anticonceptivo. Hable con su mdico acerca de los posibles efectos secundarios.  VENTAJAS  Es muy eficaz, reversible, de accin prolongada y de bajo mantenimiento.  No hay efectos secundarios relacionados con el estrgeno.  El DIU puede ser utilizado durante la lactancia.  No est asociado con el aumento de peso.  Funciona inmediatamente despus de la insercin.  El DIU de cobre no interfiere con las hormonas femeninas.  El DIU con progesterona puede hacer que los perodos menstruales no sean tan abundantes.  El DIU de progesterona puede usarse durante 5 aos.  El DIU de cobre puede usarse durante 10 aos. DESVENTAJAS  El DIU de progesterona puede estar asociado con patrones de sangrado irregular.  El DIU de cobre puede hacer que el flujo menstrual ms abundante y doloroso.  Puede experimentar clicos y sangrado vaginal despus de la insercin. Document Released: 02/15/2010 Document Revised: 11/20/2011 ExitCare Patient Information 2013 ExitCare, LLC.  

## 2012-11-08 NOTE — Telephone Encounter (Signed)
Pt informed with the below note, rx sent. 

## 2012-11-09 LAB — URINALYSIS W MICROSCOPIC + REFLEX CULTURE
Bacteria, UA: NONE SEEN
Bilirubin Urine: NEGATIVE
Casts: NONE SEEN
Crystals: NONE SEEN
Glucose, UA: NEGATIVE mg/dL
Ketones, ur: NEGATIVE mg/dL
Specific Gravity, Urine: 1.024 (ref 1.005–1.030)
Urobilinogen, UA: 0.2 mg/dL (ref 0.0–1.0)
pH: 5.5 (ref 5.0–8.0)

## 2012-11-10 LAB — URINE CULTURE
Colony Count: NO GROWTH
Organism ID, Bacteria: NO GROWTH

## 2012-11-11 ENCOUNTER — Other Ambulatory Visit: Payer: Self-pay

## 2012-11-12 ENCOUNTER — Other Ambulatory Visit: Payer: Self-pay | Admitting: Gynecology

## 2012-11-12 DIAGNOSIS — E7800 Pure hypercholesterolemia, unspecified: Secondary | ICD-10-CM

## 2012-11-12 DIAGNOSIS — E559 Vitamin D deficiency, unspecified: Secondary | ICD-10-CM

## 2012-12-05 ENCOUNTER — Other Ambulatory Visit: Payer: Self-pay | Admitting: Gynecology

## 2012-12-05 DIAGNOSIS — Z1231 Encounter for screening mammogram for malignant neoplasm of breast: Secondary | ICD-10-CM

## 2013-01-15 ENCOUNTER — Other Ambulatory Visit: Payer: Self-pay | Admitting: Gynecology

## 2013-01-15 DIAGNOSIS — Z1231 Encounter for screening mammogram for malignant neoplasm of breast: Secondary | ICD-10-CM

## 2013-01-29 ENCOUNTER — Ambulatory Visit (HOSPITAL_COMMUNITY): Payer: 59

## 2013-06-18 ENCOUNTER — Encounter: Payer: Self-pay | Admitting: Gynecology

## 2013-06-18 ENCOUNTER — Ambulatory Visit (INDEPENDENT_AMBULATORY_CARE_PROVIDER_SITE_OTHER): Payer: 59 | Admitting: Gynecology

## 2013-06-18 ENCOUNTER — Other Ambulatory Visit: Payer: Self-pay | Admitting: Gynecology

## 2013-06-18 VITALS — BP 112/78

## 2013-06-18 DIAGNOSIS — B9689 Other specified bacterial agents as the cause of diseases classified elsewhere: Secondary | ICD-10-CM

## 2013-06-18 DIAGNOSIS — N898 Other specified noninflammatory disorders of vagina: Secondary | ICD-10-CM

## 2013-06-18 DIAGNOSIS — R519 Headache, unspecified: Secondary | ICD-10-CM | POA: Insufficient documentation

## 2013-06-18 DIAGNOSIS — R51 Headache: Secondary | ICD-10-CM | POA: Insufficient documentation

## 2013-06-18 DIAGNOSIS — N760 Acute vaginitis: Secondary | ICD-10-CM

## 2013-06-18 DIAGNOSIS — Z1231 Encounter for screening mammogram for malignant neoplasm of breast: Secondary | ICD-10-CM

## 2013-06-18 DIAGNOSIS — A499 Bacterial infection, unspecified: Secondary | ICD-10-CM

## 2013-06-18 DIAGNOSIS — B373 Candidiasis of vulva and vagina: Secondary | ICD-10-CM

## 2013-06-18 MED ORDER — METRONIDAZOLE 500 MG PO TABS: ORAL_TABLET | ORAL | Status: DC

## 2013-06-18 MED ORDER — FLUCONAZOLE 150 MG PO TABS: ORAL_TABLET | ORAL | Status: DC

## 2013-06-18 NOTE — Patient Instructions (Addendum)
Informacin sobre el dispositivo intrauterino  (Intrauterine Device Information) El dispositivo intrauterino (DIU) se inserta en el tero e impide el embarazo. Hay dos tipos de DIU:   DIU de cobre. Este tipo de DIU est recubierto con un alambre de cobre y se inserta dentro del tero. El cobre hace que el tero y las trompas de Falopio produzcan un liquido que [employer] espermatozoides. El DIU de cobre puede Geneticist, molecular durante 10 aos.  DIU hormonal. Este tipo de DIU contiene la hormona progestina (progesterona sinttica). La hormona espesa el moco cervical y evita que los espermatozoides ingresen al tero y tambin afina la membrana que cubre el tero para evitar la implantacin del vulo fertilizado. La hormona debilita o destruye los espermatozoides que ingresan al tero. El DIU hormonal puede Geneticist, molecular durante 5 aos. El mdico se asegurar de que usted es una buena candidata para usar el DIU cono anticonceptivo. Hable con su mdico acerca de los posibles efectos secundarios.  VENTAJAS  Es muy eficaz, reversible, de accin prolongada y de bajo mantenimiento.  No hay efectos secundarios relacionados con el estrgeno.  El DIU puede ser utilizado durante la Market researcher.  No est asociado con el aumento de [place].  Funciona inmediatamente despus de la insercin.  El DIU de cobre no interfiere con las hormonas femeninas.  El DIU con progesterona puede hacer que los perodos menstruales no sean tan abundantes.  El DIU de progesterona puede usarse durante 5 aos.  El DIU de cobre puede usarse durante 10 aos. DESVENTAJAS  El DIU de progesterona puede estar asociado con patrones de sangrado irregular.  El DIU de cobre puede hacer que el flujo menstrual ms abundante y doloroso.  Puede experimentar clicos y sangrado vaginal despus de la insercin. Document Released: 02/15/2010 Document Revised: 11/20/2011 [facility] Patient Information 2014 [place],  [place].Vaginosis bacteriana (Bacterial Vaginosis) La vaginosis bacteriana es una infeccin vaginal en la que el equilibrio normal de las bacterias de la vagina se modifica. Este equilibrio normal se ve afectado por un desarrollo excesivo de ciertas bacterias. Hay diferentes tipos de bacteria que causan la vaginosis bacteriana. Es el problema vaginal ms comn en las mujeres de edad frtil. CAUSAS  La causa de este trastorno no se conoce bien. Se produce como consecuencia de un aumento o desequilibrio de las bacterias nocivas.  Algunas actividades o conductas pueden poner en peligro el equilibrio normal de las bacterias en la vagina, y Astronomer. Entre ellas:  Tener un compaero sexual o mltiples compaeros sexuales.  Las duchas vaginales  Usar un dispositivo intrauterino (DIU) como mtodo anticonceptivo.  No se conoce el papel que juega la actividad sexual en el desarrollo de [place] VB. Sin embargo, las mujeres que nunca tuvieron relaciones sexuales raramente se infectan. El contagio no se produce en asientos de baos, camas, piscinas o por tocar objetos.  SNTOMAS  Flujo vaginal grisceo.  Olor parecido al pescado con la secrecin, en especial despus de Management consultant.  Picazn o irritacin de la vagina y la vulva.  Ardor o dolor al [employer].  Algunas mujeres no presentan ningn sntoma. DIAGNSTICO El mdico realizar un examen vaginal para diagnosticar una vaginosis bacteriana. El mdico le indicar anlisis de laboratorio y observar las muestras del lquido vaginal en el microscopio. Buscar bacterias y clulas anormales (clulas clave), pH mayor a 4.5 y [place] prueba de aminas positivo, todos ellos asociados al BV.  RIESGOS Y COMPLICACIONES  Enfermedad plvica inflamatoria (EPI).  Infecciones luego de [employer]  ciruga ginecolgica.  VIH.  Virus del Herpes TRATAMIENTO En algunos casos, la infeccin desaparece sin tratamiento. Sin embargo, todas las mujeres con  sntomas de VB deben tratarse para evitar complicaciones, especialmente si se ha planificado una ciruga ginecolgica. Los compaeros varones generalmente no necesitan tratamiento. Sin embargo, puede contagiarse entre parejas femeninas, de modo que el tratamiento se realiza para Dietitian.   La VB puede tratarse con medicamentos que destruyen grmenes (antibiticos). Estos se presentan en pldoras o en cremas vaginales. Tanto mujeres embarazadas como no embarazadas pueden usar ambos, pero se indican en dosis diferentes. Estos antibiticos no daan al beb.  La VB puede recurrir [employer]. Si esto ocurre, se prescribir un segundo tratamiento con antibiticos.  El tratamiento es importante en el caso de las mujeres [place]. Si no se trata, la VB puede causar [employer], especialmente en [employer] que ha tenido un parto prematuro en el pasado. Todas las mujeres embarazadas que tienen sntomas de VB deben ser controladas y tratadas.  En los casos de recurrencia crnica, se prescribe un tratamiento con un gel vaginal dos veces por semana INSTRUCCIONES PARA EL CUIDADO DOMICILIARIO  Tome los medicamentos que le indic el mdico.  No mantenga relaciones sexuales Librarian, academic.  Comunique a sus compaeros sexuales que sufre una infeccin vaginal. Ellos deben concurrir para un control mdico si tienen problemas como una urticaria leve o picazn.  Practique el sexo seguro. Use preservativos. Tenga un solo compaero sexual. PREVENCIN Algunos pasos bsicos de prevencin pueden ayudar a reducir el riesgo de desequilibrio de las bacterias vaginales y de sufrir VB.  No mantener relaciones sexuales (abstinencia)  No utilice duchas vaginales.  Utilice todos los [employer] Health han prescripto para el [place], aunque los sntomas hayan desaparecido.  Comunique a su compaero sexual que sufre una VB. De ese modo podr tratase, si es necesario, y podr  Economist. SOLICITE ATENCIN MDICA SI:  Los sntomas no mejoran luego de [address]  [address] de [place].  Aumentan la secrecin, el dolor o la fiebre. ASEGRESE QUE:   Comprende estas instrucciones.  Controlar su enfermedad.  Solicitar ayuda de inmediato si no mejora o empeora. PARA MS INFORMACIN: Division de STD Prevention (DSTDP), Centers for Disease Control and Prevention (Centros para el control y la prevencin de enfermedades, CDC): SolutionApps.co.za American Social Health Association (ASHA): www.ashastd.org  Document Released: 12/05/2007 Document Revised: 11/20/2011 [facility] Patient Information 2014 [place], [place]. Vaginitis monilisica (Monilial Vaginitis) La vaginitis es una inflamacin (irritacin, hinchazn) de la vagina y la vulva. Esta no es una enfermedad de transmisin sexual.  CAUSAS Este tipo de vaginitis lo causa un hongo (candida) que normalmente se encuentra en la vagina. El hongo candida se ha desarrollado hasta el punto de ocasionar problemas en el equilibrio qumico. SNTOMAS  Secrecin vaginal espesa y blanca.  Hinchazn, picazn, enrojecimiento e inflamacin de la vagina y en algunos casos de los labios vaginales (vulva).  Ardor o dolor al [employer]oco[employer]illips.  Dolor en las relaciones sexuales. DIAGNSTICO Los factores que favorecen la vaginitis moniliasica son:  Everlean Patterson de virginidad y postmenopusicas.  Embarazo.  Infecciones.  Sentir cansancio, estar enferma o estresada, especialmente si ya ha sufrido este problema en el pasado.  Diabetes Buen control ayudar a disminur la probabilidad.  Pldoras anticonceptivas  Ropa interior [place].  El uso de espumas de bao, aerosoles femeninos duchas vaginales o tampones con desodorante.  Algunos antibiticos (medicamentos que destruyen grmenes).  Si contrae alguna enfermedad puede sufrir recurrencias espordicas. TRATAMIENTO El profesional  que lo asiste prescribir medicamentos.  Hay  diferentes tipos de cremas y supositorios vaginales que tratan especficamente la vaginitis monilisica. Para infecciones por hongos recurrentes, utilice un supositorio o crema en la vagina dos veces por semana, o segn se le indique.  Tambin podrn utilizarse cremas con corticoides o anti monilisicas para la picazn o la irritacin de la vulva. Consulte con el profesional que la asiste.  Si la crema no da resultado, podr aplicarse en la vagina una solucin con azul de metileno.  El consumo de yogur puede prevenir este tipo de vaginitis. INSTRUCCIONES PARA EL CUIDADO DOMICILIARIO  Tome todos los medicamentos tal como se le indic.  No mantenga relaciones sexuales hasta que el tratamiento se haya completado, o segn las indicaciones del profesional que la asiste.  Tome baos de asiento tibios.  No se aplique duchas vaginales.  No utilice tampones, especialmente los perfumados.  Use ropa interior de algodn  [employer] pantalones ajustados y las medias tipo panty.  Comunique a sus compaeros sexuales que sufre una infeccin por hongos. Ellos deben concurrir para un control mdico si tienen sntomas como una urticaria leve o picazn.  Sus compaeros sexuales deben tratarse tambin si la infeccin es difcil de Pharmacologist.  Practique el sexo seguro  use condones  Algunos medicamentos vaginales ocasionan fallas en los condones de ltex. Los medicamentos vaginales que pueden daar los condones son:  Chiropodist cleocina  Butoconazole (Femstat)  Terconazole (Terazol) supositorios vaginales  Miconazole (Monistat) (es un medicamento de venta libre) SOLICITE ATENCIN MDICA SI:  Daphane Shepherd tiene una temperatura oral de ms de 38,9 C (102 F).  Si la infeccin empeora luego de 2 [address]  Si la infeccin no mejora luego de 3 [address].  Aparecen ampollas en o alrededor de la vagina.  Si aparece una hemorragia vaginal y no es el momento del perodo.  Siente dolor  al [employer].  Presenta problemas intestinales.  Tiene dolor durante las [employer]. Document Released: 06/07/2005 Document Revised: 11/20/2011 [facility] Patient Information 2014 [place], [place].

## 2013-06-18 NOTE — Progress Notes (Signed)
Patient presented to the office complaining of vulvar pruritus and slight discharge. Patient stated that this started last week and she bought over-the-counter antifungal agent for 3 days with minimal resolution to her symptoms. Patient had a Mirena IUD placed 3 years ago and is having no menstrual cycles but is complaining of weight gain and headaches since she has had the Mirena IUD. She has a monogamous steady relationship.  Exam: Bartholin urethra Skene was within normal limits Vagina: Thick white discharge with slight odor Cervix: IUD string barely seen no active bleeding Bimanual exam: Not done Adnexa: Not done Rectal exam: Not done  Wet prep: Many yeast Too  numerous to count white blood cells Many bacteria  Assessment/plan: Partial treatment with over-the-counter antifungal agent for vulvovaginitis (Monilia). Patient with evidence of bacterial vaginosis well. Patient will be placed on Flagyl 500 mg twice a day for 5 days. She will take Diflucan today and then 1 after she finishes the Flagyl. Patient previously had read information on the ParaGard T380A IUD and is interested in having her murmur in IUD switched to this instead so that she does not have the frequent headaches and weight gain that she has been experiencing. She will schedule this in the next few weeks after the infection has been treated.

## 2013-06-19 ENCOUNTER — Ambulatory Visit (HOSPITAL_COMMUNITY)
Admission: RE | Admit: 2013-06-19 | Discharge: 2013-06-19 | Disposition: A | Discharge status: Home / Self Care | Payer: 59 | Source: Ambulatory Visit | Attending: Gynecology | Admitting: Gynecology

## 2013-06-19 DIAGNOSIS — Z1231 Encounter for screening mammogram for malignant neoplasm of breast: Secondary | ICD-10-CM | POA: Insufficient documentation

## 2013-06-20 ENCOUNTER — Telehealth: Payer: Self-pay | Admitting: *Deleted

## 2013-06-20 MED ORDER — FLUCONAZOLE 150 MG PO TABS: 150.0000 mg | ORAL_TABLET | Freq: Once | ORAL | Status: DC

## 2013-06-20 NOTE — Telephone Encounter (Signed)
Pt was seen on 06/18/13 given Rx for Diflucan 150 direction on this should have been #2 tablets, not #1. Pt picked up her 1st tablet per note I will sent another pill.

## 2013-07-09 ENCOUNTER — Telehealth: Payer: Self-pay | Admitting: Gynecology

## 2013-07-09 NOTE — Telephone Encounter (Signed)
06/25/13-LM VM for patient that her ins covers the Paraguard & insertion at 100% under preventative svcs. [facility]

## 2013-11-27 ENCOUNTER — Ambulatory Visit (INDEPENDENT_AMBULATORY_CARE_PROVIDER_SITE_OTHER): Payer: 59 | Admitting: Gynecology

## 2013-11-27 ENCOUNTER — Encounter: Payer: Self-pay | Admitting: Gynecology

## 2013-11-27 VITALS — BP 118/76 | Ht 59.75 in | Wt 175.0 lb

## 2013-11-27 DIAGNOSIS — L293 Anogenital pruritus, unspecified: Secondary | ICD-10-CM

## 2013-11-27 DIAGNOSIS — N898 Other specified noninflammatory disorders of vagina: Secondary | ICD-10-CM

## 2013-11-27 DIAGNOSIS — A499 Bacterial infection, unspecified: Secondary | ICD-10-CM

## 2013-11-27 DIAGNOSIS — B9689 Other specified bacterial agents as the cause of diseases classified elsewhere: Secondary | ICD-10-CM

## 2013-11-27 DIAGNOSIS — L292 Pruritus vulvae: Secondary | ICD-10-CM

## 2013-11-27 DIAGNOSIS — Z01419 Encounter for gynecological examination (general) (routine) without abnormal findings: Secondary | ICD-10-CM

## 2013-11-27 DIAGNOSIS — Z8639 Personal history of other endocrine, nutritional and metabolic disease: Secondary | ICD-10-CM

## 2013-11-27 DIAGNOSIS — N760 Acute vaginitis: Secondary | ICD-10-CM

## 2013-11-27 DIAGNOSIS — E663 Overweight: Secondary | ICD-10-CM

## 2013-11-27 LAB — COMPREHENSIVE METABOLIC PANEL
ALK PHOS: 64 U/L (ref 39–117)
ALT: 17 U/L (ref 0–35)
AST: 38 U/L — AB (ref 0–37)
Albumin: 4.1 g/dL (ref 3.5–5.2)
BUN: 8 mg/dL (ref 6–23)
CALCIUM: 8.9 mg/dL (ref 8.4–10.5)
CO2: 28 mEq/L (ref 19–32)
CREATININE: 0.56 mg/dL (ref 0.50–1.10)
Chloride: 103 mEq/L (ref 96–112)
Glucose, Bld: 88 mg/dL (ref 70–99)
POTASSIUM: 3.6 meq/L (ref 3.5–5.3)
Sodium: 138 mEq/L (ref 135–145)
Total Bilirubin: 1.1 mg/dL (ref 0.2–1.2)
Total Protein: 7.3 g/dL (ref 6.0–8.3)

## 2013-11-27 LAB — WET PREP FOR TRICH, YEAST, CLUE: TRICH WET PREP: NONE SEEN

## 2013-11-27 LAB — CBC WITH DIFFERENTIAL/PLATELET
BASOS ABS: 0 10*3/uL (ref 0.0–0.1)
Basophils Relative: 0 % (ref 0–1)
EOS ABS: 0.1 10*3/uL (ref 0.0–0.7)
EOS PCT: 2 % (ref 0–5)
HEMATOCRIT: 40 % (ref 36.0–46.0)
Hemoglobin: 13.9 g/dL (ref 12.0–15.0)
LYMPHS PCT: 30 % (ref 12–46)
Lymphs Abs: 2.2 10*3/uL (ref 0.7–4.0)
MCH: 33.1 pg (ref 26.0–34.0)
MCHC: 34.8 g/dL (ref 30.0–36.0)
MCV: 95.2 fL (ref 78.0–100.0)
MONO ABS: 0.4 10*3/uL (ref 0.1–1.0)
Monocytes Relative: 6 % (ref 3–12)
Neutro Abs: 4.5 10*3/uL (ref 1.7–7.7)
Neutrophils Relative %: 62 % (ref 43–77)
Platelets: 226 10*3/uL (ref 150–400)
RBC: 4.2 MIL/uL (ref 3.87–5.11)
RDW: 13 % (ref 11.5–15.5)
WBC: 7.2 10*3/uL (ref 4.0–10.5)

## 2013-11-27 LAB — TSH: TSH: 1.114 u[IU]/mL (ref 0.350–4.500)

## 2013-11-27 LAB — LIPID PANEL
CHOL/HDL RATIO: 4.9 ratio
Cholesterol: 211 mg/dL — ABNORMAL HIGH (ref 0–200)
HDL: 43 mg/dL (ref >39)
LDL CALC: 136 mg/dL — AB (ref 0–99)
Triglycerides: 160 mg/dL — ABNORMAL HIGH (ref <150)
VLDL: 32 mg/dL (ref 0–40)

## 2013-11-27 MED ORDER — METRONIDAZOLE 0.75 % VA GEL: 1.0000 | Freq: Two times a day (BID) | VAGINAL | Status: DC

## 2013-11-27 MED ORDER — FLUCONAZOLE 150 MG PO TABS: ORAL_TABLET | ORAL | Status: DC

## 2013-11-27 NOTE — Patient Instructions (Signed)
Fluconazole tablets Qu es este medicamento? El FLUCONAZOL es un medicamento antimictico. Se utiliza para tratar ciertos tipos de infecciones micticas o por levadura. Este medicamento puede ser utilizado para otros usos; si tiene alguna pregunta consulte con su proveedor de atencin mdica o con su farmacutico. MARCAS COMERCIALES DISPONIBLES: Diflucan Qu le debo informar a mi profesional de la salud antes de tomar este medicamento? Necesita saber si usted presenta alguno de los siguientes problemas o situaciones: -antecedentes de pulso cardaco irregular -enfermedad renal -una reaccin alrgica o inusual al fluconazol, a otros azoles u otros medicamentos, alimentos, colorantes o conservantes -si est embarazada o buscando quedar embarazada -si est amamantando a un beb Cmo debo utilizar este medicamento? Tome este medicamento por va oral. Siga las instrucciones de la etiqueta del [place]. No tome su medicamento con una frecuencia mayor a la indicada. Hable con su pediatra para informarse acerca del uso de este medicamento en nios. Puede requerir atencin especial. Este medicamento ha sido recetado a nios tan menores como de 6 meses de [place]. Sobredosis: Pngase en contacto inmediatamente con un centro toxicolgico o una sala de urgencia si usted cree que haya tomado demasiado medicamento. ATENCIN: [employer] es solo para usted. No comparta este medicamento con nadie. Qu sucede si me olvido de una dosis? Si olvida una dosis, tmela lo antes posible. Si es casi la hora de la prxima dosis, tome slo esa dosis. No tome dosis adicionales o dobles. Qu puede interactuar con este medicamento? No tome esta medicina con ninguno de los siguientes medicamentos: -astemizol -ciertos medicamentos para el pulso cardiaco irregular, tales como dofetilida, dronedarona, quinidina -cisapride -eritromicina -lomitapida -otros medicamentos que prolongan el intervalo QT (causa un ritmo  cardiaco anormal) -pimozida -terfenadina -tioridazina -tolvaptn -ziprasidona Esta medicina tambin puede interactuar con los siguientes medicamentos: -medicamentos antivirales para el VIH o SIDA -pldoras anticonceptivas -ciertos antibiticos, tales como rifabutina, rifampicina -ciertos medicamentos para la presin sangunea, tales como amlodipina, isradipina, felodipino, hidroclorotiazida, losartn, nifedipina -ciertos medicamentos para el cncer, tales como ciclofosfamida, vinblastina, vincristina -ciertos medicamentos para el colesterol, tales como atorvastatina, lovastatina, fluvastatina, simvastatina -ciertos medicamentos para la depresin, ansiedad o trastornos psicticos, tales como amitriptilina, midazolam, nortriptilina, triazolam -ciertos medicamentos para la diabetes, tales como glipizida, gliburida, tolbutamida -ciertos medicamentos para Chief Technology Officer, tales como alfentanilo, fentanilo, metadona -ciertos medicamentos para convulsiones, tales como carbamazepina, fenitona -ciertos medicamentos que tratan o previenen cogulos sanguneos, tales como warfarina -halofantrina -medicamentos que reducen su capacidad de combatir infecciones, tales como ciclosporina, prednisona, tacrolimo -los AINE, medicamentos para el dolor o inflamacin, tales como celecoxib, diclofenaco, flurbiprofeno, ibuprofeno, meloxicam, naproxeno -otros medicamentos para infecciones micticas  -sirolims -teofilina -tofacitinib Puede ser que esta lista no menciona todas las posibles interacciones. Informe a su profesional de [employer] de [employer] productos a base de hierbas, medicamentos de [place] o suplementos nutritivos que est tomando. Si usted fuma, consume bebidas alcohlicas o si utiliza drogas ilegales, indqueselo tambin a su profesional de [employer]. Algunas sustancias pueden interactuar con su medicamento. A qu debo estar atento al usar [employer]? Visite a su mdico o a su profesional de la  salud para chequear su evolucin peridicamente. Si toma este medicamento durante un perodo de [employer], podr Manufacturing systems engineer anlisis de [place]. Si los sntomas no mejoran, consulte a su mdico. Pueden transcurrir [employer] o meses de tratamiento antes de que algunas infecciones micticas se curen. El alcohol puede aumentar la posibilidad de daos al hgado. Evite consumir bebidas alcohlicas. Si tiene una infeccin vaginal, no tenga  relaciones sexuales hasta que haya completado el tratamiento. Puede usar una toallita higinica. No use tampones. Use ropa interior de algodn no sintticos, y recin lavadas. Qu efectos secundarios puedo tener al [employer] este medicamento? Efectos secundarios que debe informar a su mdico o a Producer, television/film/video de la salud tan pronto como sea posible: -Therapist, art como erupcin cutnea, picazn o urticarias, hinchazn de los labios, boca, lengua o garganta -orina de color amarillo oscuro -sensacin de mareos o desmayos -pulso cardiaco irregular o dolor en el pecho -enrojecimiento, formacin de ampollas, descamacin o distensin de la piel, inclusive dentro de la boca -dificultad al respirar -sangrado o magulladuras inusuales -vmito -color amarillento de los ojos o la piel Efectos secundarios que, por lo general, no requieren Psychologist, prison and probation services (debe informarlos a su mdico o a Producer, television/film/video de la salud si persisten o si son molestos): -cambios en el sabor de los alimentos -diarrea -dolor de cabeza -Programme researcher, broadcasting/film/video o nuseas Puede ser que esta lista no menciona todos los posibles efectos secundarios. Comunquese a su mdico por asesoramiento mdico [employer]. Usted puede informar los efectos secundarios a la FDA por telfono al 1-800-FDA-1088. Dnde debo guardar mi medicina? Mantngala fuera del alcance de los nios. Gurdela a [employer], a menos de 30 grados C (86 grados F). Deseche todo el medicamento  que no haya utilizado, despus de la fecha de vencimiento. ATENCIN: Este folleto es un resumen. Puede ser que no cubra toda la posible informacin. Si usted tiene preguntas acerca de esta medicina, consulte con su mdico, su farmacutico o su profesional de Radiographer, therapeutic.  2014, Elsevier/Gold Standard. (2013-05-22 14:13:08) Vaginitis monilisica (Monilial Vaginitis) La vaginitis es una inflamacin (irritacin, hinchazn) de la vagina y la vulva. Esta no es una enfermedad de transmisin sexual.  CAUSAS Este tipo de vaginitis lo causa un hongo (candida) que normalmente se encuentra en la vagina. El hongo candida se ha desarrollado hasta el punto de ocasionar problemas en el equilibrio qumico. SNTOMAS  Secrecin vaginal espesa y blanca.  Hinchazn, picazn, enrojecimiento e inflamacin de la vagina y en algunos casos de los labios vaginales (vulva).  Ardor o dolor al [employer].  Dolor en las relaciones sexuales. DIAGNSTICO Los factores que favorecen la vaginitis moniliasica son:  Everlean Patterson de virginidad y postmenopusicas.  Embarazo.  Infecciones.  Sentir cansancio, estar enferma o estresada, especialmente si ya ha sufrido este problema en el pasado.  Diabetes Buen control ayudar a disminur la probabilidad.  Pldoras anticonceptivas  Ropa interior [place].  El uso de espumas de bao, aerosoles femeninos duchas vaginales o tampones con desodorante.  Algunos antibiticos (medicamentos que destruyen grmenes).  Si contrae alguna enfermedad puede sufrir recurrencias espordicas. TRATAMIENTO El profesional que lo asiste prescribir medicamentos.  Hay diferentes tipos de cremas y supositorios vaginales que tratan especficamente la vaginitis monilisica. Para infecciones por hongos recurrentes, utilice un supositorio o crema en la vagina dos veces por semana, o segn se le indique.  Tambin podrn utilizarse cremas con corticoides o anti monilisicas para la picazn o la  irritacin de la vulva. Consulte con el profesional que la asiste.  Si la crema no da resultado, podr aplicarse en la vagina una solucin con azul de metileno.  El consumo de yogur puede prevenir este tipo de vaginitis. INSTRUCCIONES PARA EL CUIDADO DOMICILIARIO  Tome todos los medicamentos tal como se le indic.  No mantenga relaciones sexuales hasta que el tratamiento se haya completado, o segn las indicaciones del profesional que la asiste.  [employer]  de asiento tibios.  No se aplique duchas vaginales.  No utilice tampones, especialmente los perfumados.  Use ropa interior de algodn  [employer] los pantalones ajustados y las medias tipo panty.  Comunique a sus compaeros sexuales que sufre una infeccin por hongos. Ellos deben concurrir para un control mdico si tienen sntomas como una urticaria leve o picazn.  Sus compaeros sexuales deben tratarse tambin si la infeccin es difcil de Pharmacologisteliminar.  Practique el sexo seguro  use condones  Algunos medicamentos vaginales ocasionan fallas en los condones de ltex. Los medicamentos vaginales que pueden daar los condones son:  ChiropodistCrema cleocina  Butoconazole (Femstat)  Terconazole (Terazol) supositorios vaginales  Miconazole (Monistat) (es un medicamento de venta libre) SOLICITE ATENCIN MDICA SI:  Daphane ShepherdUsted tiene una temperatura oral de ms de 38,9 C (102 F).  Si la infeccin empeora luego de 2 [address] de [address]amiento.  Si la infeccin no mejora luego de 3 [address] de tratamiento.  Aparecen ampollas en o alrededor de la vagina.  Si aparece una hemorragia vaginal y no es el momento del perodo.  Siente dolor al ConocoPhillipsorinar.  Presenta problemas intestinales.  Tiene dolor durante las The St. Paul Travelersrelaciones sexuales. Document Released: 06/07/2005 Document Revised: 11/20/2011 [facility] AExitCare Patient Information 2014 [place], [place].

## 2013-11-27 NOTE — Progress Notes (Signed)
Sharon Blair 1966-06-17 [id number]   History:    48 y.o.  for annual gyn exam with complaint of vulvar pruritus and a thick white discharge. Patient also has been concerned about weight gain since she has been on the Mirena IUD which is due to be removed next year. She was weighing 183 pounds last year and is down to 175. Patient has had history in the past vitamin D deficiency but is currently not taking her account number vitamin D. Her Tdap and flu vaccine are up-to-date.  Patient has informed me that she has been having abdominal pains and [place] [facility] and is in the process of having an endoscopy and further evaluation at the end of this month.  She would like to change her IUD to a nonhormonal IUD to help with her weight.  Past medical history,surgical history, family history and social history were all reviewed and documented in the EPIC chart.  Gynecologic History No LMP recorded. Patient is not currently having periods (Reason: IUD). Contraception: IUD Last Pap: 2013. Results were: normal Last mammogram: 2014. Results were: normal  Obstetric History OB History  Gravida Para Term Preterm AB SAB TAB Ectopic Multiple Living  6 4 4  1 1    4     # Outcome Date GA Lbr Len/2nd Weight Sex Delivery Anes PTL Lv  6 SAB           5 TRM     F SVD  N Y  4 TRM     F SVD  N Y  3 TRM     M SVD  N Y  2 TRM     F SVD  N Y  1 GRA                ROS: A ROS was performed and pertinent positives and negatives are included in the history.  GENERAL: No fevers or chills. HEENT: No change in vision, no earache, sore throat or sinus congestion. NECK: No pain or stiffness. CARDIOVASCULAR: No chest pain or pressure. No palpitations. PULMONARY: No shortness of breath, cough or wheeze. GASTROINTESTINAL: No abdominal pain, nausea, vomiting or diarrhea, melena or bright red blood per rectum. GENITOURINARY: No urinary frequency, urgency, hesitancy  or dysuria. MUSCULOSKELETAL: No joint or muscle pain, no back pain, no recent trauma. DERMATOLOGIC: No rash, no itching, no lesions. ENDOCRINE: No polyuria, polydipsia, no heat or cold intolerance. No recent change in weight. HEMATOLOGICAL: No anemia or easy bruising or bleeding. NEUROLOGIC: No headache, seizures, numbness, tingling or weakness. PSYCHIATRIC: No depression, no loss of interest in normal activity or change in sleep pattern.     Exam: chaperone present  BP 118/76  Ht 4' 11.75" (1.518 m)  Wt 175 lb (79.379 kg)  BMI 34.45 kg/m2  Body mass index is 34.45 kg/(m^2).  General appearance : Well developed well nourished female. No acute distress HEENT: Neck supple, trachea midline, no carotid bruits, no thyroidmegaly Lungs: Clear to auscultation, no rhonchi or wheezes, or rib retractions  Heart: Regular rate and rhythm, no murmurs or gallops Breast:Examined in sitting and supine position were symmetrical in appearance, no palpable masses or tenderness,  no skin retraction, no nipple inversion, no nipple discharge, no skin discoloration, no axillary or supraclavicular lymphadenopathy Abdomen: no palpable masses or tenderness, no rebound or guarding Extremities: no edema or skin discoloration or tenderness  Pelvic:  Bartholin, Urethra, Skene Glands: Within normal limits  Vagina: Thick white discharge  Cervix: No gross lesions or discharge, IUD string seen  Uterus  anteverted, normal size, shape and consistency, non-tender and mobile  Adnexa  Without masses or tenderness  Anus and perineum  normal   Rectovaginal  normal sphincter tone without palpated masses or tenderness             Hemoccult not indicated   Wet prep: Many yeast, few clue cells, many white blood cells and many bacteria  Assessment/Plan:  48 y.o. female for annual exam with vaginal yeast and bacterial vaginosis. She will be placed on Diflucan 150 mg one by mouth today. She will use MetroGel vaginal each  bedtime for 7 nights. Pap smear not done today in accordance to the new guidelines. Patient was reminded to do her monthly breast exam and to schedule a mammogram. She will check for insurance coverage for her IUD so that we can changes in the next 2 weeks as her infection clears up. She was reminded of the importance to take her vitamin D3 2000 units daily. The following labs her work today: Fasting lipid profile, comprehensive metabolic panel, TSH, urinalysis, and vitamin D. We discussed importance of regular exercise for osteoporosis prevention as well.  Note: This dictation was prepared with  Dragon/digital dictation along withSmart phrase technology. Any transcriptional errors that result from this process are unintentional.   Ok Edwards MD, 11:36 AM 11/27/2013

## 2013-11-28 LAB — URINALYSIS W MICROSCOPIC + REFLEX CULTURE
Bilirubin Urine: NEGATIVE
Casts: NONE SEEN
Crystals: NONE SEEN
Glucose, UA: NEGATIVE mg/dL
Ketones, ur: NEGATIVE mg/dL
NITRITE: NEGATIVE
Protein, ur: NEGATIVE mg/dL
Specific Gravity, Urine: 1.02 (ref 1.005–1.030)
UROBILINOGEN UA: 0.2 mg/dL (ref 0.0–1.0)
pH: 6 (ref 5.0–8.0)

## 2013-11-28 LAB — VITAMIN D 25 HYDROXY (VIT D DEFICIENCY, FRACTURES): VIT D 25 HYDROXY: 37 ng/mL (ref 30–89)

## 2013-11-29 LAB — URINE CULTURE: Colony Count: 5000

## 2013-12-01 ENCOUNTER — Encounter: Payer: Self-pay | Admitting: Gynecology

## 2013-12-04 ENCOUNTER — Telehealth: Payer: Self-pay | Admitting: Gynecology

## 2013-12-04 NOTE — Telephone Encounter (Signed)
12/04/13-Claudia called the pt and left a message in Spanish that the Paraguard and insertion are [facility]

## 2013-12-11 ENCOUNTER — Ambulatory Visit: Payer: 59 | Admitting: Gynecology

## 2014-06-15 ENCOUNTER — Other Ambulatory Visit: Payer: Self-pay | Admitting: Gynecology

## 2014-06-15 DIAGNOSIS — Z1231 Encounter for screening mammogram for malignant neoplasm of breast: Secondary | ICD-10-CM

## 2014-06-26 ENCOUNTER — Ambulatory Visit (HOSPITAL_COMMUNITY)
Admission: RE | Admit: 2014-06-26 | Discharge: 2014-06-26 | Disposition: A | Discharge status: Home / Self Care | Payer: 59 | Source: Ambulatory Visit | Attending: Gynecology | Admitting: Gynecology

## 2014-06-26 DIAGNOSIS — Z1231 Encounter for screening mammogram for malignant neoplasm of breast: Secondary | ICD-10-CM | POA: Diagnosis present

## 2014-07-13 ENCOUNTER — Encounter: Payer: Self-pay | Admitting: Gynecology

## 2014-12-03 ENCOUNTER — Telehealth: Payer: Self-pay | Admitting: *Deleted

## 2014-12-03 ENCOUNTER — Other Ambulatory Visit (HOSPITAL_COMMUNITY)
Admission: RE | Admit: 2014-12-03 | Discharge: 2014-12-03 | Disposition: A | Discharge status: Home / Self Care | Payer: 59 | Source: Ambulatory Visit | Attending: Gynecology | Admitting: Gynecology

## 2014-12-03 ENCOUNTER — Ambulatory Visit (INDEPENDENT_AMBULATORY_CARE_PROVIDER_SITE_OTHER): Payer: 59 | Admitting: Gynecology

## 2014-12-03 ENCOUNTER — Encounter: Payer: Self-pay | Admitting: Gynecology

## 2014-12-03 VITALS — BP 120/76 | Ht 59.75 in | Wt 184.0 lb

## 2014-12-03 DIAGNOSIS — N760 Acute vaginitis: Secondary | ICD-10-CM | POA: Diagnosis not present

## 2014-12-03 DIAGNOSIS — N898 Other specified noninflammatory disorders of vagina: Secondary | ICD-10-CM

## 2014-12-03 DIAGNOSIS — A499 Bacterial infection, unspecified: Secondary | ICD-10-CM

## 2014-12-03 DIAGNOSIS — Z01419 Encounter for gynecological examination (general) (routine) without abnormal findings: Secondary | ICD-10-CM | POA: Diagnosis not present

## 2014-12-03 DIAGNOSIS — B9689 Other specified bacterial agents as the cause of diseases classified elsewhere: Secondary | ICD-10-CM

## 2014-12-03 DIAGNOSIS — Z8639 Personal history of other endocrine, nutritional and metabolic disease: Secondary | ICD-10-CM | POA: Diagnosis not present

## 2014-12-03 DIAGNOSIS — Z1151 Encounter for screening for human papillomavirus (HPV): Secondary | ICD-10-CM | POA: Diagnosis present

## 2014-12-03 DIAGNOSIS — B3731 Acute candidiasis of vulva and vagina: Secondary | ICD-10-CM

## 2014-12-03 DIAGNOSIS — B373 Candidiasis of vulva and vagina: Secondary | ICD-10-CM | POA: Diagnosis not present

## 2014-12-03 DIAGNOSIS — Z113 Encounter for screening for infections with a predominantly sexual mode of transmission: Secondary | ICD-10-CM | POA: Diagnosis present

## 2014-12-03 LAB — CBC WITH DIFFERENTIAL/PLATELET
BASOS PCT: 0 % (ref 0–1)
Basophils Absolute: 0 10*3/uL (ref 0.0–0.1)
EOS ABS: 0.1 10*3/uL (ref 0.0–0.7)
Eosinophils Relative: 2 % (ref 0–5)
HCT: 39.6 % (ref 36.0–46.0)
Hemoglobin: 13.5 g/dL (ref 12.0–15.0)
Lymphocytes Relative: 34 % (ref 12–46)
Lymphs Abs: 1.7 10*3/uL (ref 0.7–4.0)
MCH: 33.2 pg (ref 26.0–34.0)
MCHC: 34.1 g/dL (ref 30.0–36.0)
MCV: 97.3 fL (ref 78.0–100.0)
MONOS PCT: 6 % (ref 3–12)
MPV: 10.8 fL (ref 8.6–12.4)
Monocytes Absolute: 0.3 10*3/uL (ref 0.1–1.0)
NEUTROS PCT: 58 % (ref 43–77)
Neutro Abs: 2.8 10*3/uL (ref 1.7–7.7)
Platelets: 250 10*3/uL (ref 150–400)
RBC: 4.07 MIL/uL (ref 3.87–5.11)
RDW: 13.5 % (ref 11.5–15.5)
WBC: 4.9 10*3/uL (ref 4.0–10.5)

## 2014-12-03 LAB — COMPREHENSIVE METABOLIC PANEL
ALT: 25 U/L (ref 0–35)
AST: 80 U/L — AB (ref 0–37)
Albumin: 4.3 g/dL (ref 3.5–5.2)
Alkaline Phosphatase: 63 U/L (ref 39–117)
BUN: 10 mg/dL (ref 6–23)
CHLORIDE: 103 meq/L (ref 96–112)
CO2: 24 meq/L (ref 19–32)
CREATININE: 0.49 mg/dL — AB (ref 0.50–1.10)
Calcium: 8.9 mg/dL (ref 8.4–10.5)
Glucose, Bld: 86 mg/dL (ref 70–99)
Potassium: 3.9 mEq/L (ref 3.5–5.3)
SODIUM: 138 meq/L (ref 135–145)
TOTAL PROTEIN: 7.5 g/dL (ref 6.0–8.3)
Total Bilirubin: 1.2 mg/dL (ref 0.2–1.2)

## 2014-12-03 LAB — LIPID PANEL
Cholesterol: 238 mg/dL — ABNORMAL HIGH (ref 0–200)
HDL: 43 mg/dL — ABNORMAL LOW (ref >=46)
LDL Cholesterol: 175 mg/dL — ABNORMAL HIGH (ref 0–99)
TRIGLYCERIDES: 100 mg/dL (ref <150)
Total CHOL/HDL Ratio: 5.5 Ratio
VLDL: 20 mg/dL (ref 0–40)

## 2014-12-03 LAB — WET PREP FOR TRICH, YEAST, CLUE: Trich, Wet Prep: NONE SEEN

## 2014-12-03 MED ORDER — TINIDAZOLE 500 MG PO TABS: ORAL_TABLET | ORAL | Status: DC

## 2014-12-03 MED ORDER — TINIDAZOLE 500 MG PO TABS: 500.0000 mg | ORAL_TABLET | Freq: Once | ORAL | Status: DC

## 2014-12-03 MED ORDER — FLUCONAZOLE 150 MG PO TABS: ORAL_TABLET | ORAL | Status: DC

## 2014-12-03 NOTE — Progress Notes (Signed)
Sharon Blair 1966-01-05 [id number]   History:    49 y.o. for annual exam with a complaint of vaginal discharge and pruritus. She is in a monogamous relationship.Review of patient's record demonstrated that she has had history vitamin D deficiency in the past. Patient's vaccines are up-to-date. She is fasting today for her blood work.She has a Mirena IUD that was placed in 2011 and her cycles are very light and regular. Patient reports no past history of any abnormal Pap smears.   Past medical history,surgical history, family history and social history were all reviewed and documented in the EPIC chart.  Gynecologic History No LMP recorded. Patient is not currently having periods (Reason: IUD). Contraception: IUD Last Pap: 2013. Results were: normal Last mammogram: 2015. Results were: normal  Obstetric History OB History  Gravida Para Term Preterm AB SAB TAB Ectopic Multiple Living  # Outcome Date GA Lbr Len/2nd Weight Sex Delivery Anes PTL Lv  6 SAB           5 Term     F Vag-Spont  N Y  4 Term     F Vag-Spont  N Y  3 Term     M Vag-Spont  N Y  2 Term     F Vag-Spont  N Y  1 Gravida                ROS: A ROS was performed and pertinent positives and negatives are included in the history.  GENERAL: No fevers or chills. HEENT: No change in vision, no earache, sore throat or sinus congestion. NECK: No pain or stiffness. CARDIOVASCULAR: No chest pain or pressure. No palpitations. PULMONARY: No shortness of breath, cough or wheeze. GASTROINTESTINAL: No abdominal pain, nausea, vomiting or diarrhea, melena or bright red blood per rectum. GENITOURINARY: No urinary frequency, urgency, hesitancy or dysuria. MUSCULOSKELETAL: No joint or muscle pain, no back pain, no recent trauma. DERMATOLOGIC: No rash, no itching, no lesions. ENDOCRINE: No polyuria, polydipsia, no heat or cold intolerance. No recent change in weight. HEMATOLOGICAL: No anemia or easy bruising or  bleeding. NEUROLOGIC: No headache, seizures, numbness, tingling or weakness. PSYCHIATRIC: No depression, no loss of interest in normal activity or change in sleep pattern.     Exam: chaperone present  BP 120/76 mmHg  Ht 4' 11.75" (1.518 m)  Wt 184 lb (83.462 kg)  BMI 36.22 kg/m2  Body mass index is 36.22 kg/(m^2).  General appearance : Well developed well nourished female. No acute distress HEENT: Eyes: no retinal hemorrhage or exudates,  Neck supple, trachea midline, no carotid bruits, no thyroidmegaly Lungs: Clear to auscultation, no rhonchi or wheezes, or rib retractions  Heart: Regular rate and rhythm, no murmurs or gallops Breast:Examined in sitting and supine position were symmetrical in appearance, no palpable masses or tenderness,  no skin retraction, no nipple inversion, no nipple discharge, no skin discoloration, no axillary or supraclavicular lymphadenopathy Abdomen: no palpable masses or tenderness, no rebound or guarding Extremities: no edema or skin discoloration or tenderness  Pelvic:  Bartholin, Urethra, Skene Glands: Within normal limits             Vagina: Hyperemic vaginal mucosa slight clear discharge  Cervix: No gross lesions or discharge  Uterus  anteverted, normal size, shape and consistency, non-tender and mobile  Adnexa  Without masses or tenderness  Anus and perineum  normal   Rectovaginal  normal sphincter tone without  palpated masses or tenderness             Hemoccult not indicated   Wet prep: Positive amine, many clue cells, moderate of ABC, too numerous to count bacteria  Assessment/Plan:  49 y.o. female for annual exam with clinical evidence of bacterial vaginosis and yeast vaginitis. Patient will be prescribed Tindamax 500 mg. She is to take for TAB was today and repeat in 48 hours. She is also point to be prescribed Diflucan 150 mg to take 1 tablet by mouth today and repeat in 72 hours. Her Pap smear was done today we'll obtain a GC and Chlamydia  cultures well from her Pap smear. Patient was reminded to do her monthly breast exam. She is due for mammogram later this year. Because of patient's past history vitamin D deficiency we will check a vitamin D level today along with the following screening labs: CBC, fasting lipid profile, comprehensive metabolic panel, TSH, and urinalysis. Patient will return back next week to change her Mirena IUD and we will going to switch her to a nonhormonal IUD such as a ParaGard T380A because of her history of weight gain.  Ok EdwardsFERNANDEZ,Cartha Rotert H MD, 9:38 AM 12/03/2014

## 2014-12-03 NOTE — Patient Instructions (Signed)
Fluconazole injection Qu es este medicamento? El FLUCONAZOL es un medicamento antimictico. Se utiliza para tratar o prevenir ciertos tipos de infecciones micticas o por levadura. Este medicamento puede ser utilizado para otros usos; si tiene alguna pregunta consulte con su proveedor de atencin mdica o con su farmacutico. MARCAS COMERCIALES DISPONIBLES: Diflucan Qu le debo informar a mi profesional de la salud antes de tomar este medicamento? Necesita saber si usted presenta alguno de los siguientes problemas o situaciones: -antecedentes de pulso cardaco irregular -enfermedad renal -una reaccin alrgica o inusual al fluconazol, a otros medicamentos antimicticos, alimentos, colorantes o conservantes -si est embarazada o buscando quedar embarazada -si est amamantando a un beb Cmo debo utilizar este medicamento? Este medicamento se administra mediante inyeccin por va intravenosa. Generalmente lo administra un profesional de Technical sales engineer en un hospital o en un entorno clnico. Si recibe este medicamento en su domicilio, se le ensearn cmo preparar y Biomedical engineer. selo exactamente como se le indique. Use sus dosis a intervalos regulares. No use su medicamento con una frecuencia mayor a la indicada. Es importante que deseche las agujas y las jeringas usadas en un recipiente resistente a los pinchazos. No las deseche en una basura. Si no tiene un recipiente resistente a los pinchazos, consulte a Midwife o su proveedor de atencin para obtenerlo. Hable con su pediatra para informarse acerca del uso de este medicamento en nios. Puede requerir atencin especial. Sobredosis: Pngase en contacto inmediatamente con un centro toxicolgico o una sala de urgencia si usted cree que haya tomado demasiado medicamento. ATENCIN: [employer] es solo para usted. No comparta este medicamento con nadie. Qu sucede si me olvido de una dosis? No se aplica en este caso. Qu  puede interactuar con este medicamento? No tome esta medicina con ninguno de los siguientes medicamentos: -astemizol -ciertos medicamentos para el pulso cardiaco irregular, tales como dofetilida, dronedarona, quinidina -cisapride -eritromicina -lomitapida -otros medicamentos que prolongan el intervalo QT (causa un ritmo cardiaco anormal) -pimozida -terfenadina -tioridazina -tolvaptn -ziprasidona Esta medicina tambin puede interactuar con los siguientes medicamentos: -medicamentos antivirales para el VIH o SIDA -pldoras anticonceptivas -ciertos antibiticos, tales como rifabutina, rifampicina -ciertos medicamentos para la presin sangunea, tales como amlodipina, isradipina, felodipino, hidroclorotiazida, losartn, nifedipina -ciertos medicamentos para el cncer, tales como ciclofosfamida, vinblastina, vincristina -ciertos medicamentos para el colesterol, tales como atorvastatina, lovastatina, fluvastatina, simvastatina -ciertos medicamentos para la depresin, ansiedad o trastornos psicticos, tales como amitriptilina, midazolam, nortriptilina, triazolam -ciertos medicamentos para la diabetes, tales como glipizida, gliburida, tolbutamida -ciertos medicamentos para Conservation officer, historic buildings, tales como alfentanilo, fentanilo, metadona -ciertos medicamentos para convulsiones, tales como carbamazepina, fenitona -ciertos medicamentos que tratan o previenen cogulos sanguneos, tales como warfarina -halofantrina -medicamentos que reducen su capacidad de combatir infecciones, tales como ciclosporina, prednisona, tacrolimo -los AINE, medicamentos para el dolor o inflamacin, tales como celecoxib, diclofenaco, flurbiprofeno, ibuprofeno, meloxicam, naproxeno -otros medicamentos para infecciones micticas -sirolims -teofilina -tofacitinib Puede ser que esta lista no menciona todas las posibles interacciones. Informe a su profesional de [employer]	[place] de [employer] productos a base de hierbas, medicamentos de [place] o suplementos nutritivos que est tomando. Si usted fuma, consume bebidas alcohlicas o si utiliza drogas ilegales, indqueselo tambin a su profesional de KB Home	[place]. Algunas sustancias pueden interactuar con su medicamento. A qu debo estar atento al usar [employer]? Si los sntomas no mejoran, consulte a Adult nurse. Si toma este medicamento durante un perodo de [employer], podr Customer service manager anlisis de [place]. Pueden transcurrir [employer] o meses de tratamiento  antes de que algunas infecciones micticas se curen. El alcohol puede aumentar la posibilidad de daos al hgado de [place]. Evite consumir bebidas alcohlicas. Qu efectos secundarios puedo tener al [employer] este medicamento? Efectos secundarios que debe informar a su mdico o a Barrister's clerk de la salud tan pronto como sea posible: -Chief of Staff como erupcin cutnea, picazn o urticarias, hinchazn de los labios, boca, lengua o garganta -orina de color amarillo oscuro -sensacin de mareos o desmayos -pulso cardiaco irregular o dolor en el pecho -dolor, enrojecimiento en el lugar de la inyeccin -enrojecimiento, formacin de ampollas, descamacin o distensin de la piel, inclusive dentro de la boca -dolor estomacal -dificultad al respirar -sangrado o magulladuras inusuales -vmito -color amarillento de los ojos o la piel Efectos secundarios que, por lo general, no requieren Geophysical data processor (debe informarlos a su mdico o a Barrister's clerk de la salud si persisten o si son molestos): -cambios en el sabor de los alimentos -diarrea -dolor de cabeza -Higher education careers adviser, nuseas Puede ser que esta lista no menciona todos los posibles efectos secundarios. Comunquese a su mdico por asesoramiento mdico [employer]. Usted puede informar los efectos secundarios a la FDA por telfono al 1-800-FDA-1088. Dnde debo guardar mi medicina? Mantngala fuera del alcance de los  nios. Si est Theatre manager en su domicilio recibir instrucciones sobre cmo guardar este medicamento. Deseche todo el medicamento que no haya utilizado, despus de la fecha de vencimiento. ATENCIN: Este folleto es un resumen. Puede ser que no cubra toda la posible informacin. Si usted tiene preguntas acerca de esta medicina, consulte con su mdico, su farmacutico o su profesional de Technical sales engineer.  2015, Elsevier/Gold Standard. (2013-05-22 14:08:20) Tinidazole tablets Qu es este medicamento? El TINIDAZOL es un medicamento antiinfeccioso. Se utiliza para tratar la amebiasis, giardiasis, tricomonosis y vaginosis. No es efectivo para resfros, gripe u otras infecciones de origen viral. Este medicamento puede ser utilizado para otros usos; si tiene alguna pregunta consulte con su proveedor de atencin mdica o con su farmacutico. MARCAS COMERCIALES DISPONIBLES: Tindamax Qu le debo informar a mi profesional de la salud antes de tomar este medicamento? Necesita saber si usted presenta alguno de los siguientes problemas o situaciones: -anemia u otros trastornos sanguneos -si consume bebidas alcohlicas con frecuencia -recibe hemodilisis -trastorno de convulsiones -una reaccin alrgica o inusual al tinidazol, a otros medicamentos, alimentos, colorantes o conservantes -si est embarazada o buscando quedar embarazada -si est amamantando a un beb Cmo debo utilizar este medicamento? Tome este medicamento por va oral con un vaso lleno de agua. Siga las instrucciones de la etiqueta del [place]. Tomar con alimentos. Tome sus dosis a intervalos regulares. No tome su medicamento con una frecuencia mayor a la indicada. Complete todas las dosis de su medicamento como se le haya indicado aun si se siente mejor. No omita ninguna dosis o suspenda el uso de su medicamento antes de lo indicado. Hable con su pediatra para informarse acerca del uso de este medicamento en nios. Aunque este  medicamento ha sido recetado a nios tan menores como de 3 aos de edad para condiciones selectivas, las precauciones se aplican. Sobredosis: Pngase en contacto inmediatamente con un centro toxicolgico o una sala de urgencia si usted cree que haya tomado demasiado medicamento. ATENCIN: [employer] es solo para usted. No comparta este medicamento con nadie. Qu sucede si me olvido de una dosis? Si olvida una dosis, tmela lo antes posible. Si es casi la hora de la prxima dosis, tome slo esa dosis.  No tome dosis adicionales o dobles. Qu puede interactuar con este medicamento? No tome esta medicina con ninguno de los siguientes medicamentos: -alcohol o cualquier producto que contenga alcohol -solucin oral de amprenavir -disulfiram -inyeccin de paclitaxel -solucin oral de ritonavir -solucin oral de sertralina -inyeccin de sulfametoxasol-trimetoprima Esta medicina tambin puede interactuar con los siguientes medicamentos: -colestiramina -cimetidina -conivaptn -ciclosporina -fluorouracilo -fosfenitona, fenitona -quetoconazol -litio -fenobarbital -tacrolimo -warfarina Puede ser que esta lista no menciona todas las posibles interacciones. Informe a su profesional de KB Home	[place] de [employer] productos a base de hierbas, medicamentos de [place] o suplementos nutritivos que est tomando. Si usted fuma, consume bebidas alcohlicas o si utiliza drogas ilegales, indqueselo tambin a su profesional de KB Home	[place]. Algunas sustancias pueden interactuar con su medicamento. A qu debo estar atento al usar Coca[employer]Cola? Si los sntomas no mejoran o si empeoran, consulte con su mdico o con su profesional de KB Home	[place]. Evite consumir las bebidas alcohlicas mientras toma este medicamento y durante tres das despus. El alcohol puede hacerle sentir mareado, enfermo o enrojecimiento. Si est recibiendo tratamiento para [place] enfermedad de transmisin sexual, evite todo contacto  sexual hasta que haya terminado el [place]. Es posible que su pareja tambin necesite [place]. Qu efectos secundarios puedo tener al [employer] este medicamento? Efectos secundarios que debe informar a su mdico o a Barrister's clerk de la salud tan pronto como sea posible: -Chief of Staff como erupcin cutnea, picazn o urticarias, hinchazn de la cara, labios o lengua -problemas respiratorios -confusin, depresin -manchas oscuras o blancas en la boca -sensacin de desmayos o mareos, cadas -fiebre, infeccin -entumecimiento, hormigueo, dolor o debilidad en las manos o pies -dolor al orinar -convulsiones -cansancio o debilidad inusual -irritacin o flujo vaginal -vmito Efectos secundarios que, por lo general, no requieren atencin mdica (debe informarlos a su mdico o a su profesional de la salud si persisten o si son molestos): -orina de color marrn oscuro o rojo -diarrea -dolor de cabeza -prdida del apetito -sabor metlico -nuseas -Higher education careers adviser Puede ser que esta lista no menciona todos los posibles efectos secundarios. Comunquese a su mdico por asesoramiento mdico [employer]. Usted puede informar los efectos secundarios a la FDA por telfono al 1-800-FDA-1088. Dnde debo guardar mi medicina? Mantngala fuera del alcance de los nios. [place] a [employer], entre 15 y 84 grados C (21 y 62 grados F). Protjala de la luz y de la humedad. Mantenga el envase bien cerrado. Deseche todo el medicamento que no haya utilizado, despus de la fecha de vencimiento. ATENCIN: Este folleto es un resumen. Puede ser que no cubra toda la posible informacin. Si usted tiene preguntas acerca de esta medicina, consulte con su mdico, su farmacutico o su profesional de Technical sales engineer.  2015, Elsevier/Gold Standard. (2007-04-07 16:39:00) Vaginitis monilisica (Monilial Vaginitis) La vaginitis es una inflamacin (irritacin, hinchazn) de la vagina  y la vulva. Esta no es una enfermedad de transmisin sexual.  CAUSAS Este tipo de vaginitis lo causa un hongo (candida) que normalmente se encuentra en la vagina. El hongo candida se ha desarrollado hasta el punto de ocasionar problemas en el equilibrio qumico. SNTOMAS  Secrecin vaginal espesa y blanca.  Hinchazn, picazn, enrojecimiento e inflamacin de la vagina y en algunos casos de los labios vaginales (vulva).  Ardor o dolor al [employer].  Dolor en [place]. DIAGNSTICO Los factores que favorecen la vaginitis moniliasica son:  Kyla Balzarine de virginidad y postmenopusicas.  Embarazo.  Infecciones.  Sentir cansancio, estar enferma o estresada, especialmente  si ya ha sufrido este problema en el pasado.  Diabetes Buen control ayudar a disminur la probabilidad.  Pldoras anticonceptivas  Ropa interior [place].  El uso de espumas de bao, aerosoles femeninos duchas vaginales o tampones con desodorante.  Algunos antibiticos (medicamentos que destruyen grmenes).  Si contrae alguna enfermedad puede sufrir recurrencias espordicas. [place] profesional que lo asiste prescribir medicamentos.  Hay diferentes tipos de cremas y supositorios vaginales que tratan especficamente la vaginitis monilisica. Para infecciones por hongos recurrentes, utilice un supositorio o crema en la vagina dos veces por semana, o segn se le indique.  Tambin podrn utilizarse cremas con corticoides o anti monilisicas para la picazn o la irritacin de la vulva. Consulte con el profesional que la asiste.  Si la crema no da resultado, podr aplicarse en la vagina una solucin con azul de metileno.  El consumo de yogur puede prevenir este tipo de vaginitis. INSTRUCCIONES [place] todos los medicamentos tal como se le indic.  No mantenga relaciones sexuales hasta que el tratamiento se haya completado, o segn las indicaciones del profesional que  la asiste.  Tome baos de asiento tibios.  No se aplique duchas vaginales.  No utilice tampones, especialmente los perfumados.  Use ropa interior de algodn  [employer] pantalones ajustados y las medias tipo panty.  Comunique a sus compaeros sexuales que sufre una infeccin por hongos. Ellos deben concurrir para un control mdico si tienen sntomas como una urticaria leve o picazn.  Sus compaeros sexuales deben tratarse tambin si la infeccin es difcil de Radiographer, therapeutic.  Practique el sexo seguro - use condones  Algunos medicamentos vaginales ocasionan fallas en los condones de ltex. Los medicamentos vaginales que pueden daar los condones son:  Building services engineer cleocina  Butoconazole (Femstat)  Terconazole (Terazol) supositorios vaginales  Miconazole (Monistat) (es un medicamento de venta libre) SOLICITE ATENCIN MDICA SI:  [place] Session tiene una temperatura oral de ms de 38,9 C (102 F).  Si la infeccin empeora luego de 2 das de tratamiento.  Si la infeccin no mejora luego de 3 das de tratamiento.  Aparecen ampollas en o alrededor de la vagina.  Si aparece una hemorragia vaginal y no es el momento del perodo.  Siente dolor al [employer].  Presenta problemas intestinales.  Tiene dolor durante las [employer]. Document Released: 06/07/2005 Document Revised: 11/20/2011 [facility] Patient Information 2015 [place]. This information is not intended to replace advice given to you by your health care provider. Make sure you discuss any questions you have with your health care provider. Vaginosis bacteriana (Bacterial Vaginosis) La vaginosis bacteriana es una infeccin de la vagina. Se produce cuando una cantidad excesiva de ciertos grmenes (bacterias) crece en la vagina. [place] los medicamentos tal como se lo indic su mdico.  Finalice la prescripcin completa, aunque comience a sentirse mejor.  No mantenga relaciones sexuales hasta que  finalice sus medicamentos y se International aid/development worker.  Comunique a sus compaeros sexuales que sufre una infeccin. Deben consultar a su mdico para iniciar un tratamiento.  Practique el sexo seguro. Use preservativos. Tenga solo un compaero sexual. SOLICITE AYUDA SI:  No mejora luego de 3 das de [place].  Observa una secrecin (prdida) de color gris ms abundante que proviene de la vagina.  Siente ms dolor que antes.  Tiene fiebre. ASEGRESE DE QUE:   Comprende estas instrucciones.  Controlar su afeccin.  Recibir ayuda de inmediato si no mejora o si empeora. Document Released: 11/24/2008 Document Revised:  06/18/2013 ExitCare Patient Information 2015 [place], [place]. This information is not intended to replace advice given to you by your health care provider. Make sure you discuss any questions you have with your health care provider.

## 2014-12-03 NOTE — Telephone Encounter (Signed)
CORRECT DIRECTIONS SENT FOR TINDAMAX 500 MG TAKE 4 TABLETS TODAY AND REPEAT IN 72 HOURS

## 2014-12-04 LAB — URINALYSIS W MICROSCOPIC + REFLEX CULTURE
Bilirubin Urine: NEGATIVE
CASTS: NONE SEEN
Crystals: NONE SEEN
Glucose, UA: NEGATIVE mg/dL
HGB URINE DIPSTICK: NEGATIVE
KETONES UR: NEGATIVE mg/dL
Leukocytes, UA: NEGATIVE
Nitrite: NEGATIVE
PH: 6.5 (ref 5.0–8.0)
Protein, ur: NEGATIVE mg/dL
Specific Gravity, Urine: 1.023 (ref 1.005–1.030)
Urobilinogen, UA: 0.2 mg/dL (ref 0.0–1.0)

## 2014-12-04 LAB — TSH: TSH: 0.925 u[IU]/mL (ref 0.350–4.500)

## 2014-12-04 LAB — CYTOLOGY - PAP

## 2014-12-04 LAB — VITAMIN D 25 HYDROXY (VIT D DEFICIENCY, FRACTURES): VIT D 25 HYDROXY: 20 ng/mL — AB (ref 30–100)

## 2014-12-07 ENCOUNTER — Other Ambulatory Visit: Payer: Self-pay | Admitting: Gynecology

## 2014-12-07 ENCOUNTER — Telehealth: Payer: Self-pay | Admitting: Gynecology

## 2014-12-07 DIAGNOSIS — E7800 Pure hypercholesterolemia, unspecified: Secondary | ICD-10-CM

## 2014-12-07 DIAGNOSIS — R945 Abnormal results of liver function studies: Secondary | ICD-10-CM

## 2014-12-07 DIAGNOSIS — E559 Vitamin D deficiency, unspecified: Secondary | ICD-10-CM

## 2014-12-07 DIAGNOSIS — N289 Disorder of kidney and ureter, unspecified: Secondary | ICD-10-CM

## 2014-12-07 DIAGNOSIS — R7989 Other specified abnormal findings of blood chemistry: Secondary | ICD-10-CM

## 2014-12-07 MED ORDER — VITAMIN D (ERGOCALCIFEROL) 1.25 MG (50000 UNIT) PO CAPS
50000.0000 [IU] | ORAL_CAPSULE | ORAL | Status: DC
Start: 2014-12-07 — End: 2015-12-08

## 2014-12-07 NOTE — Telephone Encounter (Signed)
12/07/14-Sharon Blair to call patient and let her know that her insurance will cover the removal of her Mirena and insertion of Paraguard IUD at 100%, no copay.Patient speaks primarily Spanish/wl

## 2014-12-11 ENCOUNTER — Encounter: Payer: Self-pay | Admitting: *Deleted

## 2014-12-11 NOTE — Progress Notes (Signed)
PARAGUARD DEVICE RESERVED FOR PT

## 2015-01-08 ENCOUNTER — Ambulatory Visit: Payer: 59 | Admitting: Gynecology

## 2015-01-15 ENCOUNTER — Ambulatory Visit (INDEPENDENT_AMBULATORY_CARE_PROVIDER_SITE_OTHER): Payer: 59 | Admitting: Gynecology

## 2015-01-15 ENCOUNTER — Encounter: Payer: Self-pay | Admitting: Gynecology

## 2015-01-15 VITALS — BP 126/88

## 2015-01-15 DIAGNOSIS — B373 Candidiasis of vulva and vagina: Secondary | ICD-10-CM | POA: Diagnosis not present

## 2015-01-15 DIAGNOSIS — B3731 Acute candidiasis of vulva and vagina: Secondary | ICD-10-CM

## 2015-01-15 DIAGNOSIS — N898 Other specified noninflammatory disorders of vagina: Secondary | ICD-10-CM

## 2015-01-15 LAB — WET PREP FOR TRICH, YEAST, CLUE
Clue Cells Wet Prep HPF POC: NONE SEEN
TRICH WET PREP: NONE SEEN

## 2015-01-15 MED ORDER — TERCONAZOLE 0.8 % VA CREA: 1.0000 | TOPICAL_CREAM | Freq: Every day | VAGINAL | Status: DC

## 2015-01-15 MED ORDER — FLUCONAZOLE 150 MG PO TABS: 150.0000 mg | ORAL_TABLET | Freq: Once | ORAL | Status: DC

## 2015-01-15 NOTE — Progress Notes (Signed)
   Patient presented to the office today to replace her Mirena IUD for a nonhormonal IUD like the ParaGard T380A. Patient was complaining of vaginal discharge today. She stated that she had one Diflucan at home and to get a few days ago but continues to have symptoms. She had recently been on antibiotics for sinusitis. She has been in a monogamous relationship.  Exam: Bartholin urethra Skene was within normal limits Vagina: Thick white discharge was noted inflamed vaginal mucosa Cervix IUD string visualized Bimanual exam: Not done Adnexa: Not done Rectal exam not done  Wet prep moderate yeast  Assessment/plan: As a result of patient's monilial vaginitis we'll hold off on removing and inserting the new IUD until next week. She will be given a prescription of Diflucan 150 mg one by mouth today and for 3 days and she's going to use the Terazol intravaginally at bedtime.

## 2015-01-15 NOTE — Patient Instructions (Signed)
Vaginitis monilisica (Monilial Vaginitis) La vaginitis es una inflamacin (irritacin, hinchazn) de la vagina y la vulva. Esta no es una enfermedad de transmisin sexual.  CAUSAS Este tipo de vaginitis lo causa un hongo (candida) que normalmente se encuentra en la vagina. El hongo candida se ha desarrollado hasta el punto de ocasionar problemas en el equilibrio qumico. SNTOMAS  Secrecin vaginal espesa y blanca.  Hinchazn, picazn, enrojecimiento e inflamacin de la vagina y en algunos casos de los labios vaginales (vulva).  Ardor o dolor al orinar.  Dolor en las relaciones sexuales. DIAGNSTICO Los factores que favorecen la vaginitis moniliasica son:  [place] de virginidad y postmenopusicas.  Embarazo.  Infecciones.  Sentir cansancio, estar enferma o estresada, especialmente si ya ha sufrido este problema en el pasado.  Diabetes Buen control ayudar a disminur la probabilidad.  Pldoras anticonceptivas  Ropa interior muy ajustada.  El uso de espumas de bao, aerosoles femeninos duchas vaginales o tampones con desodorante.  Algunos antibiticos (medicamentos que destruyen grmenes).  Si contrae alguna enfermedad puede sufrir recurrencias espordicas. TRATAMIENTO El profesional que lo asiste prescribir medicamentos.  Hay diferentes tipos de cremas y supositorios vaginales que tratan especficamente la vaginitis monilisica. Para infecciones por hongos recurrentes, utilice un supositorio o crema en la vagina dos veces por semana, o segn se le indique.  Tambin podrn utilizarse cremas con corticoides o anti monilisicas para la picazn o la irritacin de la vulva. Consulte con el profesional que la asiste.  Si la crema no da resultado, podr aplicarse en la vagina una solucin con azul de metileno.  El consumo de yogur puede prevenir este tipo de vaginitis. INSTRUCCIONES PARA EL CUIDADO DOMICILIARIO  [place]ome todos los medicamentos tal como se le indic.  No  mantenga relaciones sexuales hasta que el tratamiento se haya completado, o segn las indicaciones del profesional que la asiste.  Tome baos de asiento tibios.  No se aplique duchas vaginales.  No utilice tampones, especialmente los perfumados.  Use ropa interior de algodn  Evite los pantalones ajustados y las medias tipo panty.  Comunique a sus compaeros sexuales que sufre una infeccin por hongos. Ellos deben concurrir para un control mdico si tienen sntomas como una urticaria leve o picazn.  Sus compaeros sexuales deben tratarse tambin si la infeccin es difcil de eliminar.  Practique el sexo seguro - use condones  Algunos medicamentos vaginales ocasionan fallas en los condones de ltex. Los medicamentos vaginales que pueden daar los condones son:  Crema cleocina  Butoconazole (Femstat)  Terconazole (Terazol) supositorios vaginales  Miconazole (Monistat) (es un medicamento de venta libre) SOLICITE ATENCIN MDICA SI:  Usted tiene una temperatura oral de ms de 38,9 C (102 F).  Si la infeccin empeora luego de 2 das de tratamiento.  Si la infeccin no mejora luego de 3 das de tratamiento.  Aparecen ampollas en o alrededor de la vagina.  Si aparece una hemorragia vaginal y no es el momento del perodo.  Siente dolor al orinar.  Presenta problemas intestinales.  Tiene dolor durante las relaciones sexuales. Document Released: 06/07/2005 Document Revised: 11/20/2011 ExitCare Patient Information 2015 [employer]. This information is not intended to replace advice given to you by your health care provider. Make sure you discuss any questions you have with your health care provider.  

## 2015-01-25 ENCOUNTER — Encounter: Payer: Self-pay | Admitting: Gynecology

## 2015-01-25 ENCOUNTER — Ambulatory Visit (INDEPENDENT_AMBULATORY_CARE_PROVIDER_SITE_OTHER): Payer: 59 | Admitting: Gynecology

## 2015-01-25 VITALS — BP 130/82

## 2015-01-25 DIAGNOSIS — E785 Hyperlipidemia, unspecified: Secondary | ICD-10-CM

## 2015-01-25 DIAGNOSIS — Z8639 Personal history of other endocrine, nutritional and metabolic disease: Secondary | ICD-10-CM | POA: Diagnosis not present

## 2015-01-25 DIAGNOSIS — Z30432 Encounter for removal of intrauterine contraceptive device: Secondary | ICD-10-CM | POA: Diagnosis not present

## 2015-01-25 NOTE — Progress Notes (Signed)
   Patient presented to the office today requesting removal of her Mirena IUD that was placed 5 years ago at another facility. She states her husband is in the process of getting a vasectomy she will use condoms until his procedure has been completed and cleared for unprotected intercourse. Review of her record indicated that she is currently being treated for vitamin D deficiency no stressed upon her to come to the office after 12 weeks her up to check her vitamin D level and then begin taking vitamin D3 2000 units daily. We also discussed for hyperlipidemia and she had been referred to the nutritionist and also provided with literature information on cholesterol lowering diet and exercise. She will return back in 6 months for fasting lipid profile if she continues with hyperlipidemia and explain to her that she will need to be started on some form of statin.  Exam: Bartholin urethra Skene was within normal limits Vagina: No lesions or discharge Cervix: IUD string visualized The IUD string was grasped with a [place] clamp retrieved shown to the patient discarded  Assessment/plan: #1 Mirena IUD was removed patient will use barrier contraception M tell husband's vasectomy completed #2 Hyperlipidemia diet and exercise program established will return back in 6 months for follow-up lipid profile #3 vitamin D deficiency complete 3 months treatment with vitamin D 50,000 units return to office for vitamin D level check in 3 months and then vitamin D3 cholecalciferol 1 tablet daily (2000 units).

## 2015-06-11 ENCOUNTER — Other Ambulatory Visit: Payer: Self-pay

## 2015-06-11 DIAGNOSIS — Z1231 Encounter for screening mammogram for malignant neoplasm of breast: Secondary | ICD-10-CM

## 2015-06-28 ENCOUNTER — Ambulatory Visit: Payer: 59

## 2015-11-05 ENCOUNTER — Ambulatory Visit
Admission: RE | Admit: 2015-11-05 | Discharge: 2015-11-05 | Disposition: A | Discharge status: Home / Self Care | Payer: 59 | Source: Ambulatory Visit

## 2015-11-05 DIAGNOSIS — Z1231 Encounter for screening mammogram for malignant neoplasm of breast: Secondary | ICD-10-CM

## 2015-12-06 ENCOUNTER — Encounter: Payer: 59 | Admitting: Gynecology

## 2015-12-08 ENCOUNTER — Encounter: Payer: Self-pay | Admitting: Women's Health

## 2015-12-08 ENCOUNTER — Ambulatory Visit (INDEPENDENT_AMBULATORY_CARE_PROVIDER_SITE_OTHER): Payer: 59 | Admitting: Women's Health

## 2015-12-08 ENCOUNTER — Encounter: Payer: 59 | Admitting: Gynecology

## 2015-12-08 VITALS — BP 124/82 | Ht 59.0 in | Wt 182.0 lb

## 2015-12-08 DIAGNOSIS — Z13220 Encounter for screening for lipoid disorders: Secondary | ICD-10-CM | POA: Diagnosis not present

## 2015-12-08 DIAGNOSIS — Z01419 Encounter for gynecological examination (general) (routine) without abnormal findings: Secondary | ICD-10-CM | POA: Diagnosis not present

## 2015-12-08 LAB — CBC WITH DIFFERENTIAL/PLATELET
Basophils Absolute: 0 10*3/uL (ref 0.0–0.1)
Basophils Relative: 0 % (ref 0–1)
EOS ABS: 0.1 10*3/uL (ref 0.0–0.7)
Eosinophils Relative: 1 % (ref 0–5)
HCT: 39 % (ref 36.0–46.0)
HEMOGLOBIN: 13.7 g/dL (ref 12.0–15.0)
LYMPHS ABS: 1.9 10*3/uL (ref 0.7–4.0)
Lymphocytes Relative: 35 % (ref 12–46)
MCH: 34 pg (ref 26.0–34.0)
MCHC: 35.1 g/dL (ref 30.0–36.0)
MCV: 96.8 fL (ref 78.0–100.0)
MONO ABS: 0.2 10*3/uL (ref 0.1–1.0)
MONOS PCT: 4 % (ref 3–12)
MPV: 11.1 fL (ref 8.6–12.4)
NEUTROS ABS: 3.3 10*3/uL (ref 1.7–7.7)
NEUTROS PCT: 60 % (ref 43–77)
PLATELETS: 205 10*3/uL (ref 150–400)
RBC: 4.03 MIL/uL (ref 3.87–5.11)
RDW: 13.5 % (ref 11.5–15.5)
WBC: 5.5 10*3/uL (ref 4.0–10.5)

## 2015-12-08 LAB — COMPREHENSIVE METABOLIC PANEL
ALBUMIN: 4.1 g/dL (ref 3.6–5.1)
ALT: 19 U/L (ref 6–29)
AST: 60 U/L — ABNORMAL HIGH (ref 10–35)
Alkaline Phosphatase: 60 U/L (ref 33–115)
BUN: 8 mg/dL (ref 7–25)
CHLORIDE: 105 mmol/L (ref 98–110)
CO2: 26 mmol/L (ref 20–31)
Calcium: 9 mg/dL (ref 8.6–10.2)
Creat: 0.52 mg/dL (ref 0.50–1.10)
Glucose, Bld: 89 mg/dL (ref 65–99)
POTASSIUM: 3.9 mmol/L (ref 3.5–5.3)
Sodium: 139 mmol/L (ref 135–146)
TOTAL PROTEIN: 7.3 g/dL (ref 6.1–8.1)
Total Bilirubin: 1 mg/dL (ref 0.2–1.2)

## 2015-12-08 LAB — LIPID PANEL
CHOL/HDL RATIO: 5.2 ratio — AB (ref <=5.0)
CHOLESTEROL: 258 mg/dL — AB (ref 125–200)
HDL: 50 mg/dL (ref >=46)
LDL Cholesterol: 177 mg/dL — ABNORMAL HIGH (ref <130)
TRIGLYCERIDES: 153 mg/dL — AB (ref <150)
VLDL: 31 mg/dL — AB (ref <30)

## 2015-12-08 NOTE — Patient Instructions (Signed)
Health Maintenance, Female Adopting a healthy lifestyle and getting preventive care can go a long way to promote health and wellness. Talk with your health care provider about what schedule of regular examinations is right for you. This is a good chance for you to check in with your provider about disease prevention and staying healthy. In between checkups, there are plenty of things you can do on your own. Experts have done a lot of research about which lifestyle changes and preventive measures are most likely to keep you healthy. Ask your health care provider for more information. WEIGHT AND DIET  Eat a healthy diet  Be sure to include plenty of vegetables, fruits, low-fat dairy products, and lean protein.  Do not eat a lot of foods high in solid fats, added sugars, or salt.  Get regular exercise. This is one of the most important things you can do for your health.  Most adults should exercise for at least 150 minutes each week. The exercise should increase your heart rate and make you sweat (moderate-intensity exercise).  Most adults should also do strengthening exercises at least twice a week. This is in addition to the moderate-intensity exercise.  Maintain a healthy weight  Body mass index (BMI) is a measurement that can be used to identify possible weight problems. It estimates body fat based on height and weight. Your health care provider can help determine your BMI and help you achieve or maintain a healthy weight.  For females 20 years of age and older:   A BMI below 18.5 is considered underweight.  A BMI of 18.5 to 24.9 is normal.  A BMI of 25 to 29.9 is considered overweight.  A BMI of 30 and above is considered obese.  Watch levels of cholesterol and blood lipids  You should start having your blood tested for lipids and cholesterol at 48 years of age, then have this test every 5 years.  You may need to have your cholesterol levels checked more often if:  Your lipid  or cholesterol levels are high.  You are older than 48 years of age.  You are at high risk for heart disease.  CANCER SCREENING   Lung Cancer  Lung cancer screening is recommended for adults 55-80 years old who are at high risk for lung cancer because of a history of smoking.  A yearly low-dose CT scan of the lungs is recommended for people who:  Currently smoke.  Have quit within the past 15 years.  Have at least a 30-pack-year history of smoking. A pack year is smoking an average of one pack of cigarettes a day for 1 year.  Yearly screening should continue until it has been 15 years since you quit.  Yearly screening should stop if you develop a health problem that would prevent you from having lung cancer treatment.  Breast Cancer  Practice breast self-awareness. This means understanding how your breasts normally appear and feel.  It also means doing regular breast self-exams. Let your health care provider know about any changes, no matter how small.  If you are in your 20s or 30s, you should have a clinical breast exam (CBE) by a health care provider every 1-3 years as part of a regular health exam.  If you are 40 or older, have a CBE every year. Also consider having a breast X-ray (mammogram) every year.  If you have a family history of breast cancer, talk to your health care provider about genetic screening.  If you   are at high risk for breast cancer, talk to your health care provider about having an MRI and a mammogram every year.  Breast cancer gene (BRCA) assessment is recommended for women who have family members with BRCA-related cancers. BRCA-related cancers include:  Breast.  Ovarian.  Tubal.  Peritoneal cancers.  Results of the assessment will determine the need for genetic counseling and BRCA1 and BRCA2 testing. Cervical Cancer Your health care provider may recommend that you be screened regularly for cancer of the pelvic organs (ovaries, uterus, and  vagina). This screening involves a pelvic examination, including checking for microscopic changes to the surface of your cervix (Pap test). You may be encouraged to have this screening done every 3 years, beginning at age 21.  For women ages 30-65, health care providers may recommend pelvic exams and Pap testing every 3 years, or they may recommend the Pap and pelvic exam, combined with testing for human papilloma virus (HPV), every 5 years. Some types of HPV increase your risk of cervical cancer. Testing for HPV may also be done on women of any age with unclear Pap test results.  Other health care providers may not recommend any screening for nonpregnant women who are considered low risk for pelvic cancer and who do not have symptoms. Ask your health care provider if a screening pelvic exam is right for you.  If you have had past treatment for cervical cancer or a condition that could lead to cancer, you need Pap tests and screening for cancer for at least 20 years after your treatment. If Pap tests have been discontinued, your risk factors (such as having a new sexual partner) need to be reassessed to determine if screening should resume. Some women have medical problems that increase the chance of getting cervical cancer. In these cases, your health care provider may recommend more frequent screening and Pap tests. Colorectal Cancer  This type of cancer can be detected and often prevented.  Routine colorectal cancer screening usually begins at 48 years of age and continues through 48 years of age.  Your health care provider may recommend screening at an earlier age if you have risk factors for colon cancer.  Your health care provider may also recommend using home test kits to check for hidden blood in the stool.  A small camera at the end of a tube can be used to examine your colon directly (sigmoidoscopy or colonoscopy). This is done to check for the earliest forms of colorectal  cancer.  Routine screening usually begins at age 50.  Direct examination of the colon should be repeated every 5-10 years through 48 years of age. However, you may need to be screened more often if early forms of precancerous polyps or small growths are found. Skin Cancer  Check your skin from head to toe regularly.  Tell your health care provider about any new moles or changes in moles, especially if there is a change in a mole's shape or color.  Also tell your health care provider if you have a mole that is larger than the size of a pencil eraser.  Always use sunscreen. Apply sunscreen liberally and repeatedly throughout the day.  Protect yourself by wearing long sleeves, pants, a wide-brimmed hat, and sunglasses whenever you are outside. HEART DISEASE, DIABETES, AND HIGH BLOOD PRESSURE   High blood pressure causes heart disease and increases the risk of stroke. High blood pressure is more likely to develop in:  People who have blood pressure in the high end   of the normal range (130-139/85-89 mm Hg).  People who are overweight or obese.  People who are African American.  If you are 38-23 years of age, have your blood pressure checked every 3-5 years. If you are 61 years of age or older, have your blood pressure checked every year. You should have your blood pressure measured twice--once when you are at a hospital or clinic, and once when you are not at a hospital or clinic. Record the average of the two measurements. To check your blood pressure when you are not at a hospital or clinic, you can use:  An automated blood pressure machine at a pharmacy.  A home blood pressure monitor.  If you are between 45 years and 39 years old, ask your health care provider if you should take aspirin to prevent strokes.  Have regular diabetes screenings. This involves taking a blood sample to check your fasting blood sugar level.  If you are at a normal weight and have a low risk for diabetes,  have this test once every three years after 48 years of age.  If you are overweight and have a high risk for diabetes, consider being tested at a younger age or more often. PREVENTING INFECTION  Hepatitis B  If you have a higher risk for hepatitis B, you should be screened for this virus. You are considered at high risk for hepatitis B if:  You were born in a country where hepatitis B is common. Ask your health care provider which countries are considered high risk.  Your parents were born in a high-risk country, and you have not been immunized against hepatitis B (hepatitis B vaccine).  You have HIV or AIDS.  You use needles to inject street drugs.  You live with someone who has hepatitis B.  You have had sex with someone who has hepatitis B.  You get hemodialysis treatment.  You take certain medicines for conditions, including cancer, organ transplantation, and autoimmune conditions. Hepatitis C  Blood testing is recommended for:  Everyone born from [birth year] through 1963.  Anyone with known risk factors for hepatitis C. Sexually transmitted infections (STIs)  You should be screened for sexually transmitted infections (STIs) including gonorrhea and chlamydia if:  You are sexually active and are younger than 48 years of age.  You are older than 48 years of age and your health care provider tells you that you are at risk for this type of infection.  Your sexual activity has changed since you were last screened and you are at an increased risk for chlamydia or gonorrhea. Ask your health care provider if you are at risk.  If you do not have HIV, but are at risk, it may be recommended that you take a prescription medicine daily to prevent HIV infection. This is called pre-exposure prophylaxis (PrEP). You are considered at risk if:  You are sexually active and do not regularly use condoms or know the HIV status of your partner(s).  You take drugs by injection.  You are sexually  active with a partner who has HIV. Talk with your health care provider about whether you are at high risk of being infected with HIV. If you choose to begin PrEP, you should first be tested for HIV. You should then be tested every 3 months for as long as you are taking PrEP.  PREGNANCY   If you are premenopausal and you may become pregnant, ask your health care provider about preconception counseling.  If you may  become pregnant, take 400 to 800 micrograms (mcg) of folic acid every day.  If you want to prevent pregnancy, talk to your health care provider about birth control (contraception). OSTEOPOROSIS AND MENOPAUSE   Osteoporosis is a disease in which the bones lose minerals and strength with aging. This can result in serious bone fractures. Your risk for osteoporosis can be identified using a bone density scan.  If you are 61 years of age or older, or if you are at risk for osteoporosis and fractures, ask your health care provider if you should be screened.  Ask your health care provider whether you should take a calcium or vitamin D supplement to lower your risk for osteoporosis.  Menopause may have certain physical symptoms and risks.  Hormone replacement therapy may reduce some of these symptoms and risks. Talk to your health care provider about whether hormone replacement therapy is right for you.  HOME CARE INSTRUCTIONS   Schedule regular health, dental, and eye exams.  Stay current with your immunizations.   Do not use any tobacco products including cigarettes, chewing tobacco, or electronic cigarettes.  If you are pregnant, do not drink alcohol.  If you are breastfeeding, limit how much and how often you drink alcohol.  Limit alcohol intake to no more than 1 drink per day for nonpregnant women. One drink equals 12 ounces of beer, 5 ounces of wine, or 1 ounces of hard liquor.  Do not use street drugs.  Do not share needles.  Ask your health care provider for help if  you need support or information about quitting drugs.  Tell your health care provider if you often feel depressed.  Tell your health care provider if you have ever been abused or do not feel safe at home.   This information is not intended to replace advice given to you by your health care provider. Make sure you discuss any questions you have with your health care provider.   Document Released: 03/13/2011 Document Revised: 09/18/2014 Document Reviewed: 07/30/2013 Elsevier Interactive Patient Education [employer].

## 2015-12-08 NOTE — Progress Notes (Signed)
Sharon Blair [birth year] [id number]    History:    Presents for annual exam.  Monthly 3-4 days cycle/vasectomy. Normal Pap and mammogram history. Currently on light duty at work due to back injury that occurred at work.  Past medical history, past surgical history, family history and social history were all reviewed and documented in the EPIC chart. Works at [employer]. 4 children all doing well, oldest daughter has RA. Originally from [place]. Mother hypertension and osteoporosis.  ROS:  A ROS was performed and pertinent positives and negatives are included.  Exam:  Filed Vitals:   12/08/15 0958  BP: 124/82    General appearance:  Normal Thyroid:  Symmetrical, normal in size, without palpable masses or nodularity. Respiratory  Auscultation:  Clear without wheezing or rhonchi Cardiovascular  Auscultation:  Regular rate, without rubs, murmurs or gallops  Edema/varicosities:  Not grossly evident Abdominal  Soft,nontender, without masses, guarding or rebound.  Liver/spleen:  No organomegaly noted  Hernia:  None appreciated  Skin  Inspection:  Grossly normal   Breasts: Examined lying and sitting.     Right: Without masses, retractions, discharge or axillary adenopathy.     Left: Without masses, retractions, discharge or axillary adenopathy. Gentitourinary   Inguinal/mons:  Normal without inguinal adenopathy  External genitalia:  Normal  BUS/Urethra/Skene's glands:  Normal  Vagina:  Normal  Cervix:  Normal  Uterus:  normal in size, shape and contour.  Midline and mobile  Adnexa/parametria:     Rt: Without masses or tenderness.   Lt: Without masses or tenderness.  Anus and perineum: Normal  Digital rectal exam: Normal sphincter tone without palpated masses or tenderness  Assessment/Plan:  50 y.o.MHF G6P4  for annual exam with no complaints.  Monthly 3-4 day cycle/vasectomy Obesity  Plan: SBE's, continue annual screening mammogram, calcium rich diet, vitamin D  2000 IUs daily if vitamin D level normal. Reviewed importance of increasing regular exercise and decreasing calories for weight loss. CBC, lipid panel, CMP, vitamin D, UA, Pap normal with negative HR HPV 2016, new screening guidelines reviewed.   Sharon ChallengerYOUNG,Sharon Blair [facility]WHNP, 11:36 AM 12/08/2015

## 2015-12-09 ENCOUNTER — Other Ambulatory Visit: Payer: Self-pay | Admitting: Women's Health

## 2015-12-09 LAB — URINALYSIS W MICROSCOPIC + REFLEX CULTURE
Bacteria, UA: NONE SEEN [HPF]
Bilirubin Urine: NEGATIVE
CASTS: NONE SEEN [LPF]
CRYSTALS: NONE SEEN [HPF]
Glucose, UA: NEGATIVE
KETONES UR: NEGATIVE
Leukocytes, UA: NEGATIVE
NITRITE: NEGATIVE
PH: 5.5 (ref 5.0–8.0)
Protein, ur: NEGATIVE
RBC / HPF: NONE SEEN RBC/HPF (ref <=2)
SPECIFIC GRAVITY, URINE: 1.019 (ref 1.001–1.035)
WBC, UA: NONE SEEN WBC/HPF (ref <=5)
YEAST: NONE SEEN [HPF]

## 2015-12-09 LAB — VITAMIN D 25 HYDROXY (VIT D DEFICIENCY, FRACTURES): Vit D, 25-Hydroxy: 22 ng/mL — ABNORMAL LOW (ref 30–100)

## 2015-12-09 MED ORDER — VITAMIN D (ERGOCALCIFEROL) 1.25 MG (50000 UNIT) PO CAPS: 50000.0000 [IU] | ORAL_CAPSULE | ORAL | Status: DC

## 2015-12-09 NOTE — Progress Notes (Signed)
Left message to call.

## 2016-11-10 ENCOUNTER — Other Ambulatory Visit: Payer: Self-pay | Admitting: Gynecology

## 2016-11-10 DIAGNOSIS — Z1231 Encounter for screening mammogram for malignant neoplasm of breast: Secondary | ICD-10-CM

## 2016-12-07 ENCOUNTER — Ambulatory Visit
Admission: RE | Admit: 2016-12-07 | Discharge: 2016-12-07 | Disposition: A | Discharge status: Home / Self Care | Payer: 59 | Source: Ambulatory Visit | Attending: Gynecology | Admitting: Gynecology

## 2016-12-07 DIAGNOSIS — Z1231 Encounter for screening mammogram for malignant neoplasm of breast: Secondary | ICD-10-CM

## 2016-12-08 ENCOUNTER — Encounter: Payer: 59 | Admitting: Gynecology

## 2016-12-11 ENCOUNTER — Encounter: Payer: Self-pay | Admitting: Gynecology

## 2016-12-11 ENCOUNTER — Ambulatory Visit (INDEPENDENT_AMBULATORY_CARE_PROVIDER_SITE_OTHER): Payer: 59 | Admitting: Gynecology

## 2016-12-11 VITALS — BP 128/82 | Ht 60.0 in | Wt 183.0 lb

## 2016-12-11 DIAGNOSIS — Z01411 Encounter for gynecological examination (general) (routine) with abnormal findings: Secondary | ICD-10-CM

## 2016-12-11 DIAGNOSIS — Z8639 Personal history of other endocrine, nutritional and metabolic disease: Secondary | ICD-10-CM

## 2016-12-11 DIAGNOSIS — E782 Mixed hyperlipidemia: Secondary | ICD-10-CM | POA: Diagnosis not present

## 2016-12-11 LAB — URINALYSIS W MICROSCOPIC + REFLEX CULTURE
Bacteria, UA: NONE SEEN [HPF]
Bilirubin Urine: NEGATIVE
CASTS: NONE SEEN [LPF]
Crystals: NONE SEEN [HPF]
GLUCOSE, UA: NEGATIVE
HGB URINE DIPSTICK: NEGATIVE
Ketones, ur: NEGATIVE
NITRITE: NEGATIVE
PH: 7 (ref 5.0–8.0)
Protein, ur: NEGATIVE
SPECIFIC GRAVITY, URINE: 1.019 (ref 1.001–1.035)
YEAST: NONE SEEN [HPF]

## 2016-12-11 LAB — CBC WITH DIFFERENTIAL/PLATELET
BASOS ABS: 0 {cells}/uL (ref 0–200)
Basophils Relative: 0 %
Eosinophils Absolute: 110 cells/uL (ref 15–500)
Eosinophils Relative: 2 %
HEMATOCRIT: 39.2 % (ref 35.0–45.0)
HEMOGLOBIN: 13 g/dL (ref 11.7–15.5)
LYMPHS ABS: 1870 {cells}/uL (ref 850–3900)
Lymphocytes Relative: 34 %
MCH: 32.5 pg (ref 27.0–33.0)
MCHC: 33.2 g/dL (ref 32.0–36.0)
MCV: 98 fL (ref 80.0–100.0)
MONO ABS: 385 {cells}/uL (ref 200–950)
MPV: 10.9 fL (ref 7.5–12.5)
Monocytes Relative: 7 %
NEUTROS PCT: 57 %
Neutro Abs: 3135 cells/uL (ref 1500–7800)
Platelets: 229 10*3/uL (ref 140–400)
RBC: 4 MIL/uL (ref 3.80–5.10)
RDW: 13.6 % (ref 11.0–15.0)
WBC: 5.5 10*3/uL (ref 3.8–10.8)

## 2016-12-11 LAB — LIPID PANEL
CHOLESTEROL: 230 mg/dL — AB (ref <200)
HDL: 42 mg/dL — AB (ref >50)
LDL CALC: 144 mg/dL — AB (ref <100)
TRIGLYCERIDES: 222 mg/dL — AB (ref <150)
Total CHOL/HDL Ratio: 5.5 Ratio — ABNORMAL HIGH (ref <5.0)
VLDL: 44 mg/dL — ABNORMAL HIGH (ref <30)

## 2016-12-11 LAB — COMPREHENSIVE METABOLIC PANEL
ALBUMIN: 3.8 g/dL (ref 3.6–5.1)
ALT: 45 U/L — ABNORMAL HIGH (ref 6–29)
AST: 78 U/L — AB (ref 10–35)
Alkaline Phosphatase: 66 U/L (ref 33–115)
BILIRUBIN TOTAL: 0.8 mg/dL (ref 0.2–1.2)
BUN: 11 mg/dL (ref 7–25)
CALCIUM: 8.8 mg/dL (ref 8.6–10.2)
CO2: 23 mmol/L (ref 20–31)
Chloride: 105 mmol/L (ref 98–110)
Creat: 0.56 mg/dL (ref 0.50–1.10)
Glucose, Bld: 96 mg/dL (ref 65–99)
Potassium: 3.7 mmol/L (ref 3.5–5.3)
Sodium: 138 mmol/L (ref 135–146)
Total Protein: 7.2 g/dL (ref 6.1–8.1)

## 2016-12-11 NOTE — Patient Instructions (Signed)
Deficiencia de vitaminaD (Vitamin D Deficiency) La deficiencia de vitaminaD ocurre cuando el organismo no tiene suficiente vitaminaD. La vitaminaD es importante por lo siguiente:  Ayuda al organismo a usar otros minerales que necesita.  Ayuda a [employer] fuertes y sanos.  Puede ayudar a Conservation officer, historic buildings.  Ayuda al buen funcionamiento del corazn y de otros msculos. Para obtener vitaminaD, puede hacer lo siguiente:  Consuma alimentos que contengan vitaminaD.  Consuma o beba leche u otros alimentos con agregado de vitaminaD.  Tome un suplemento de vitaminaD.  Expngase al sol. La ingesta insuficiente de vitaminaD puede reblandecer los huesos. Tambin puede causar otros problemas de [place]. CUIDADOS EN EL HOGAR  [employer] y los suplementos solamente como se lo haya indicado el mdico.  Consuma alimentos que contengan vitaminaD, entre ellos:  Productos lcteos, cereales o jugos con agregado de vitaminaD. Revise las etiquetas de los productos para saber si contienen vitaminaD.  Pescados grasos, como el salmn o la [place].  Huevos.  Ostras.  No use camas solares.  Mantenga un peso saludable. Baje de peso, si es necesario.  Concurra a todas las visitas de control como se lo haya indicado el mdico. Esto es importante. SOLICITE AYUDA SI:  Los sntomas no desaparecen.  Siente malestar estomacal (nuseas).  Vomita.  Defeca menos que lo habitual o tiene dificultades para defecar (estreimiento). Esta informacin no tiene Theme park manager el consejo del mdico. Asegrese de hacerle al mdico cualquier pregunta que tenga. Document Released: 08/17/2011 Document Revised: 05/19/2015 Document Reviewed: 01/13/2015 Elsevier Interactive Patient Education  2017 [employer].

## 2016-12-11 NOTE — Progress Notes (Signed)
Sharon Blair [birth year] [id number]   History:    51 y.o.  for annual gyn exam with no complaints today. Patient reports normal like menstrual cycles once a month. Husband has had a vasectomy. Patient with no past history of any abnormal Pap smears. Review of her records indicated that last year she was noted to have mixed hyperlipidemia and vitamin D deficiency. She was treated for vitamin D deficiency but did not return back to check her vitamin D level. She is currently not taking any calcium or vitamin D or any medication at the present time. Patient declined flu vaccine today.  Past medical history,surgical history, family history and social history were all reviewed and documented in the EPIC chart.  Gynecologic History Patient's last menstrual period was 11/10/2016. Contraception: vasectomy Last Pap: 2016. Results were: normal Last mammogram: 2018. Results were: normal  Obstetric History OB History  Gravida Para Term Preterm AB Living  SAB TAB Ectopic Multiple Live Births  1       4    # Outcome Date GA Lbr Len/2nd Weight Sex Delivery Anes PTL Lv  6 SAB           5 Term     F Vag-Spont  N LIV  4 Term     F Vag-Spont  N LIV  3 Term     M Vag-Spont  N LIV  2 Term     F Vag-Spont  N LIV  1 Gravida                ROS: A ROS was performed and pertinent positives and negatives are included in the history.  GENERAL: No fevers or chills. HEENT: No change in vision, no earache, sore throat or sinus congestion. NECK: No pain or stiffness. CARDIOVASCULAR: No chest pain or pressure. No palpitations. PULMONARY: No shortness of breath, cough or wheeze. GASTROINTESTINAL: No abdominal pain, nausea, vomiting or diarrhea, melena or bright red blood per rectum. GENITOURINARY: No urinary frequency, urgency, hesitancy or dysuria. MUSCULOSKELETAL: No joint or muscle pain, no back pain, no recent trauma. DERMATOLOGIC: No rash, no itching, no lesions. ENDOCRINE: No polyuria,  polydipsia, no heat or cold intolerance. No recent change in weight. HEMATOLOGICAL: No anemia or easy bruising or bleeding. NEUROLOGIC: No headache, seizures, numbness, tingling or weakness. PSYCHIATRIC: No depression, no loss of interest in normal activity or change in sleep pattern.     Exam: chaperone present  BP 128/82   Ht 5' (1.524 m)   Wt 183 lb (83 kg)   LMP 11/10/2016   BMI 35.74 kg/m   Body mass index is 35.74 kg/m.  General appearance : Well developed well nourished female. No acute distress HEENT: Eyes: no retinal hemorrhage or exudates,  Neck supple, trachea midline, no carotid bruits, no thyroidmegaly Lungs: Clear to auscultation, no rhonchi or wheezes, or rib retractions  Heart: Regular rate and rhythm, no murmurs or gallops Breast:Examined in sitting and supine position were symmetrical in appearance, no palpable masses or tenderness,  no skin retraction, no nipple inversion, no nipple discharge, no skin discoloration, no axillary or supraclavicular lymphadenopathy Abdomen: no palpable masses or tenderness, no rebound or guarding Extremities: no edema or skin discoloration or tenderness  Pelvic:  [place], Urethra, Skene Glands: Within normal limits             Vagina: No gross lesions or discharge  Cervix: No gross lesions or discharge  Uterus  anteverted,  normal size, shape and consistency, non-tender and mobile  Adnexa  Without masses or tenderness  Anus and perineum  normal   Rectovaginal  normal sphincter tone without palpated masses or tenderness             Hemoccult not indicated     Assessment/Plan:  51 y.o. female for annual exam with past history vitamin D deficiency currently not taking any vitamin D at the present time. Along with the following fasting lab work: Comprehensive metabolic panel, fasting lipid profile, TSH, CBC, and urinalysis we will check also her vitamin D level. Pap smear not indicated. Mammogram up-to-date.   Ok Edwards MD,  9:45 AM 12/11/2016

## 2016-12-12 LAB — VITAMIN D 25 HYDROXY (VIT D DEFICIENCY, FRACTURES): Vit D, 25-Hydroxy: 17 ng/mL — ABNORMAL LOW (ref 30–100)

## 2016-12-12 LAB — TSH: TSH: 1.04 mIU/L

## 2016-12-13 ENCOUNTER — Other Ambulatory Visit: Payer: Self-pay | Admitting: *Deleted

## 2016-12-13 DIAGNOSIS — E559 Vitamin D deficiency, unspecified: Secondary | ICD-10-CM

## 2016-12-13 LAB — URINE CULTURE: Organism ID, Bacteria: NO GROWTH

## 2016-12-13 MED ORDER — VITAMIN D (ERGOCALCIFEROL) 1.25 MG (50000 UNIT) PO CAPS: ORAL_CAPSULE | ORAL | 0 refills | Status: DC

## 2017-01-10 ENCOUNTER — Other Ambulatory Visit: Payer: Self-pay

## 2017-01-10 MED ORDER — VITAMIN D (ERGOCALCIFEROL) 1.25 MG (50000 UNIT) PO CAPS: ORAL_CAPSULE | ORAL | 0 refills | Status: DC

## 2017-01-24 ENCOUNTER — Encounter: Payer: Self-pay | Admitting: Gynecology

## 2017-10-30 IMAGING — MG DIGITAL SCREENING BILATERAL MAMMOGRAM WITH CAD
4 series · 4 of 4 positions shown · non-contrast
Comparison: Previous exam(s).

CLINICAL DATA: Screening.

EXAM:
DIGITAL SCREENING BILATERAL MAMMOGRAM WITH CAD

[L MLO]
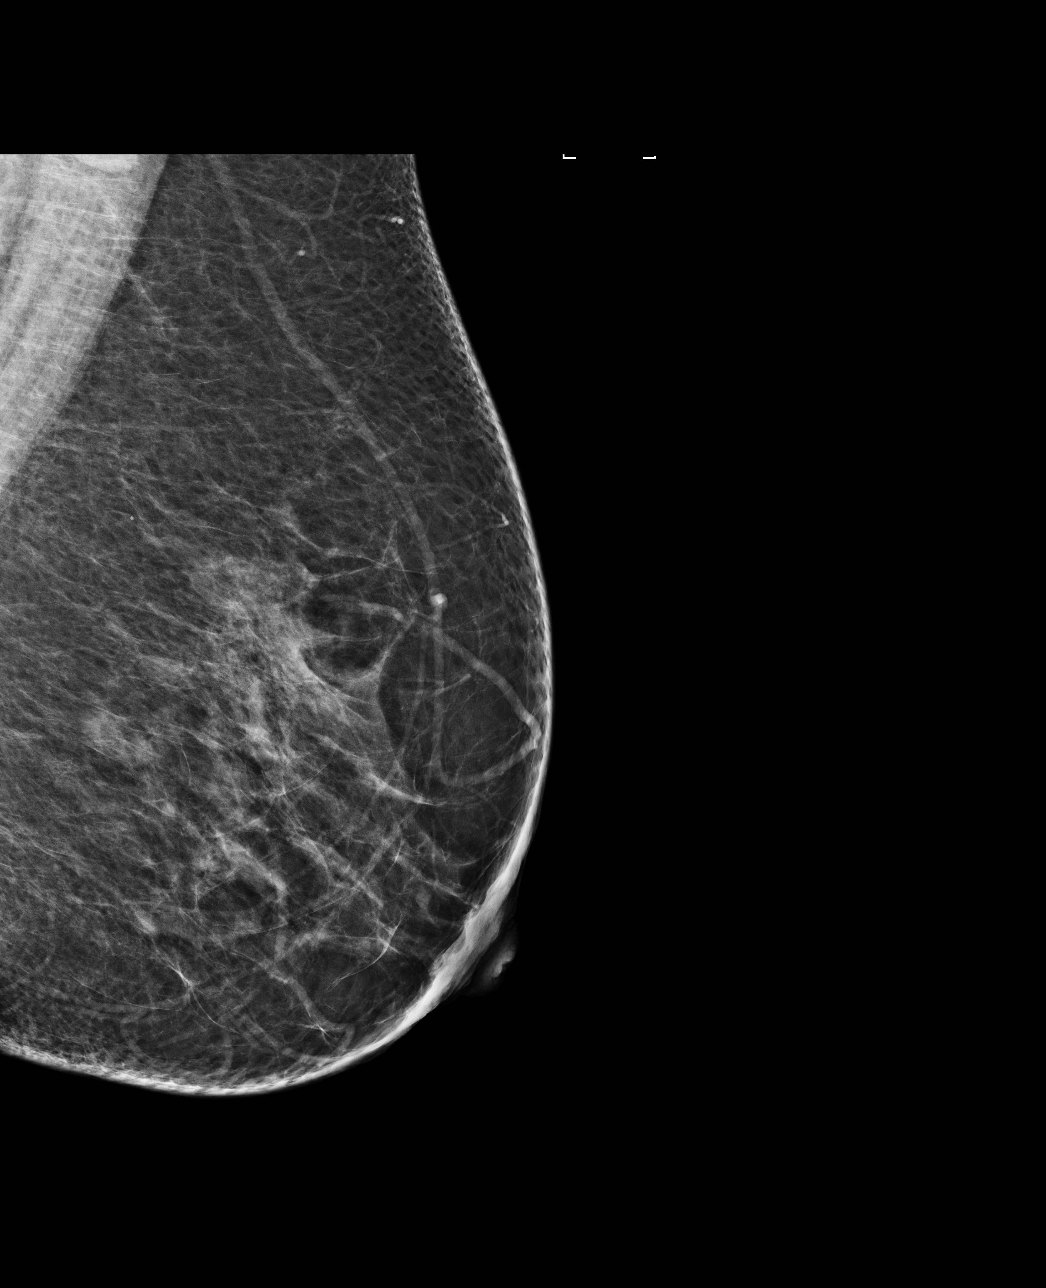

[R MLO]
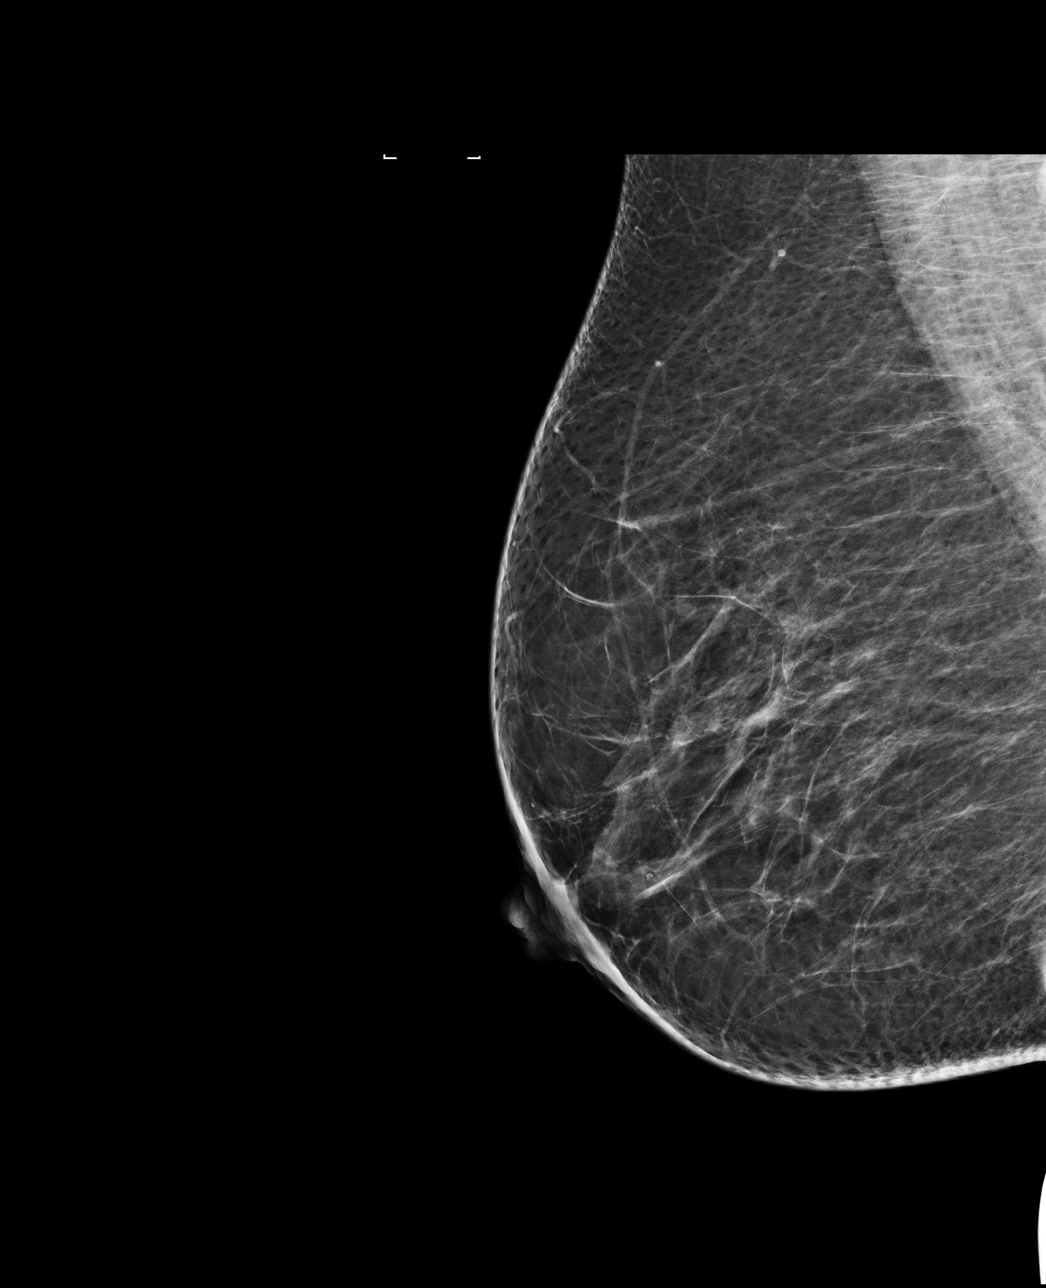

[R CC]
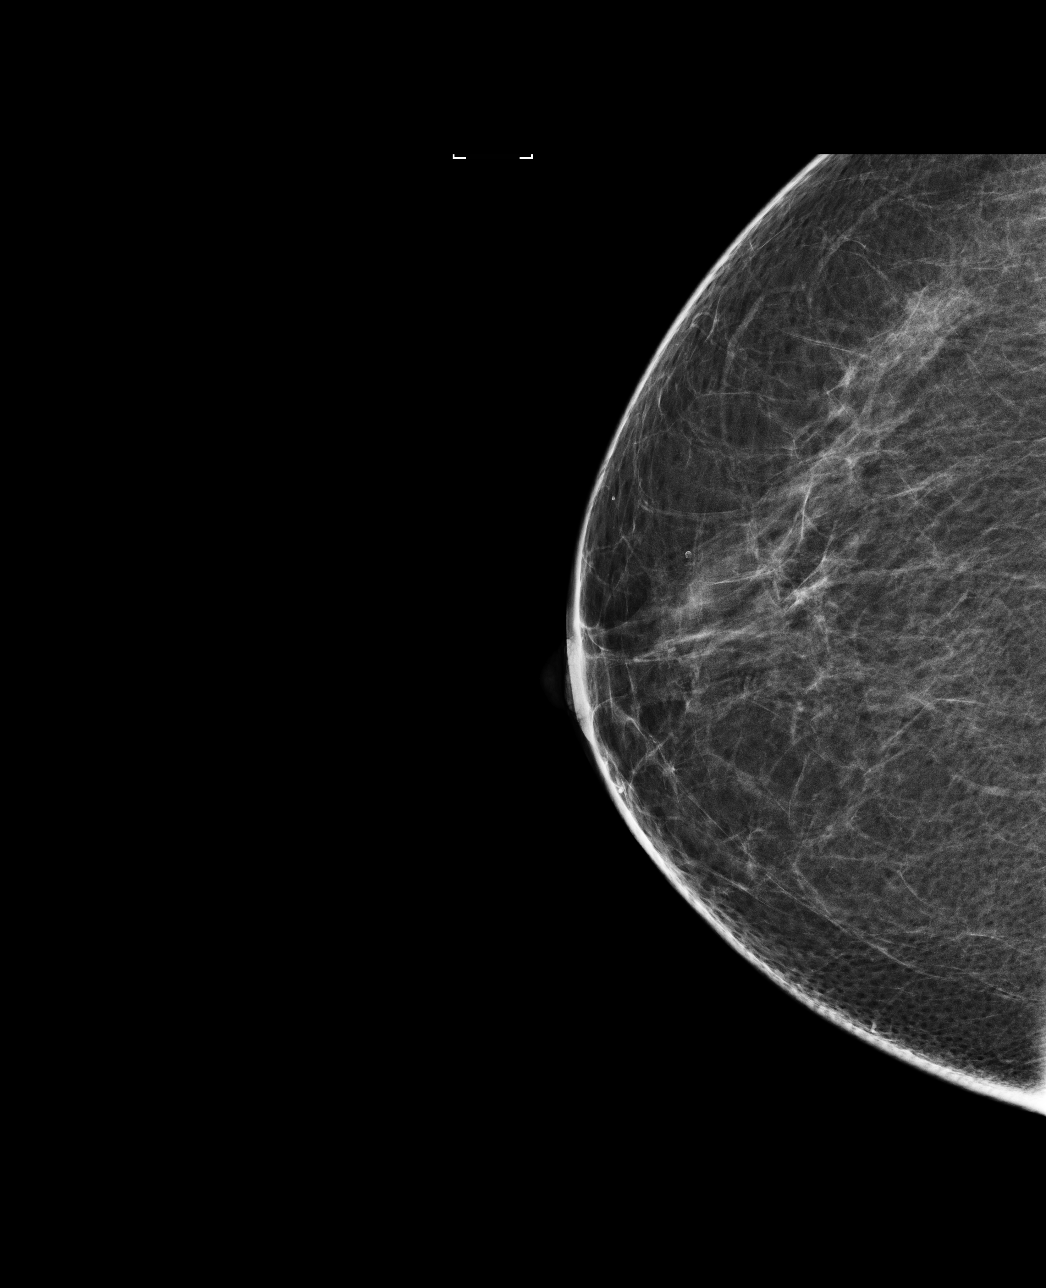

[L CC]
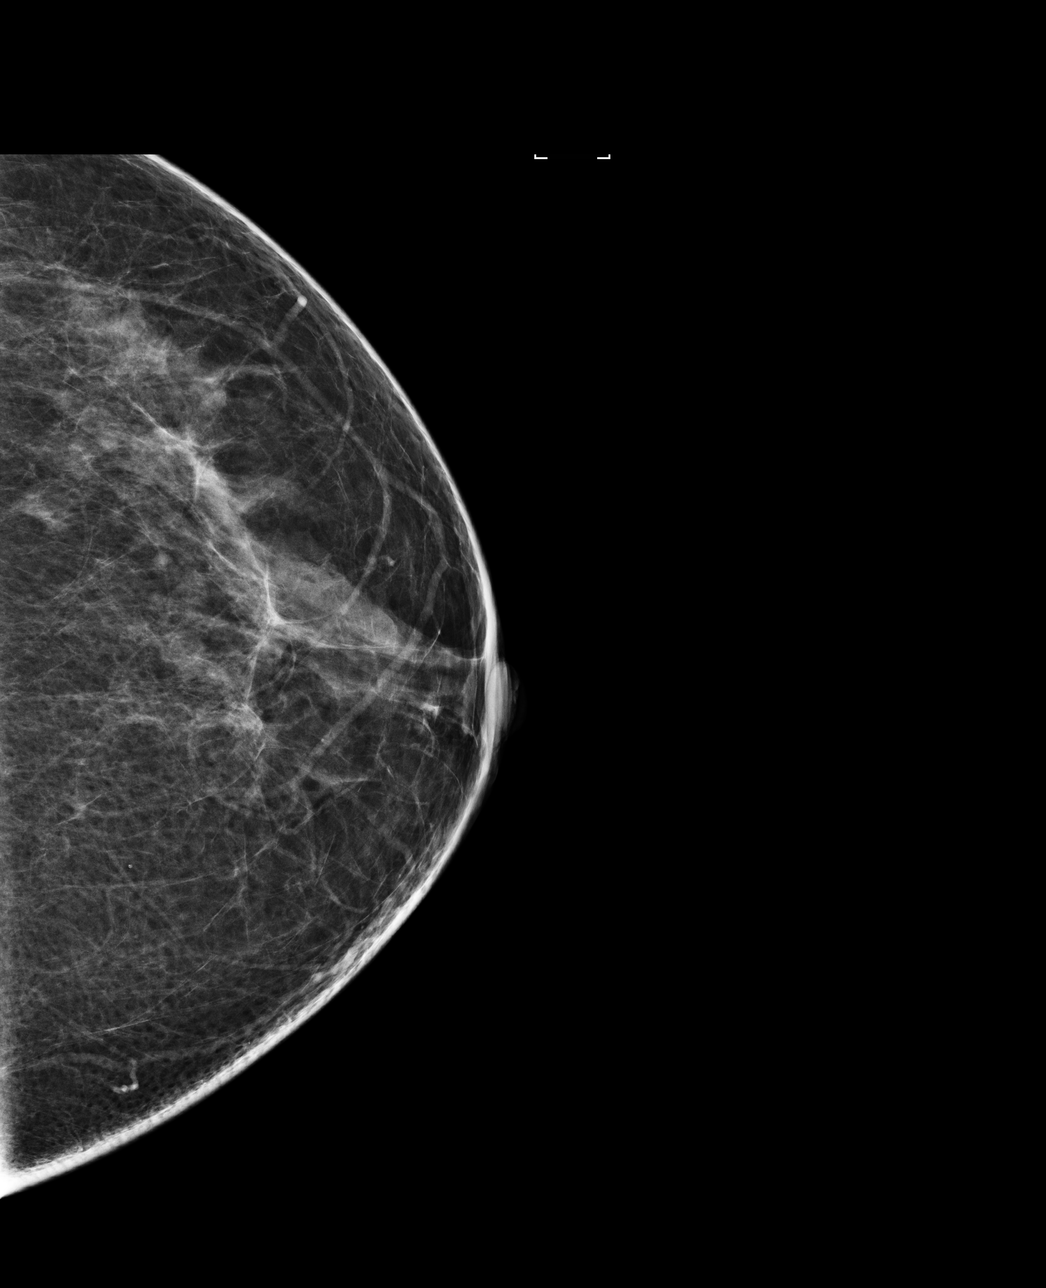

[4 of 4 positions shown; findings below may reference images not displayed]

ACR Breast Density Category b: There are scattered areas of
fibroglandular density.
FINDINGS: There are no findings suspicious for malignancy. Images were
processed with CAD.
IMPRESSION: No mammographic evidence of malignancy. A result letter of this
screening mammogram will be mailed directly to the patient.

RECOMMENDATION:
Screening mammogram in one year. (Code:AS-G-LCT)

BI-RADS CATEGORY  1: Negative.

## 2018-07-12 ENCOUNTER — Ambulatory Visit: Payer: 59 | Admitting: Family Medicine

## 2018-11-25 ENCOUNTER — Ambulatory Visit: Payer: Self-pay

## 2019-09-10 ENCOUNTER — Other Ambulatory Visit: Payer: Self-pay

## 2019-09-10 DIAGNOSIS — Z20822 Contact with and (suspected) exposure to covid-19: Secondary | ICD-10-CM

## 2019-09-12 LAB — NOVEL CORONAVIRUS, NAA: SARS-CoV-2, NAA: NOT DETECTED

## 2021-11-07 ENCOUNTER — Ambulatory Visit (INDEPENDENT_AMBULATORY_CARE_PROVIDER_SITE_OTHER): Payer: 59 | Admitting: Medical

## 2021-11-07 ENCOUNTER — Encounter: Payer: Self-pay | Admitting: Medical

## 2021-11-07 VITALS — BP 130/80 | HR 76 | Temp 98.0°F | Resp 18 | Ht 60.0 in | Wt 192.0 lb

## 2021-11-07 DIAGNOSIS — Z1211 Encounter for screening for malignant neoplasm of colon: Secondary | ICD-10-CM | POA: Diagnosis not present

## 2021-11-07 DIAGNOSIS — Z124 Encounter for screening for malignant neoplasm of cervix: Secondary | ICD-10-CM

## 2021-11-07 DIAGNOSIS — Z0000 Encounter for general adult medical examination without abnormal findings: Secondary | ICD-10-CM | POA: Diagnosis not present

## 2021-11-07 LAB — CBC WITH DIFFERENTIAL/PLATELET
Basophils Absolute: 0 10*3/uL (ref 0.0–0.1)
Basophils Relative: 0.4 % (ref 0.0–3.0)
Eosinophils Absolute: 0.1 10*3/uL (ref 0.0–0.7)
Eosinophils Relative: 1.8 % (ref 0.0–5.0)
HCT: 40.8 % (ref 36.0–46.0)
Hemoglobin: 13.8 g/dL (ref 12.0–15.0)
Lymphocytes Relative: 35 % (ref 12.0–46.0)
Lymphs Abs: 1.4 10*3/uL (ref 0.7–4.0)
MCHC: 33.9 g/dL (ref 30.0–36.0)
MCV: 98.5 fl (ref 78.0–100.0)
Monocytes Absolute: 0.3 10*3/uL (ref 0.1–1.0)
Monocytes Relative: 7.3 % (ref 3.0–12.0)
Neutro Abs: 2.2 10*3/uL (ref 1.4–7.7)
Neutrophils Relative %: 55.5 % (ref 43.0–77.0)
Platelets: 197 10*3/uL (ref 150.0–400.0)
RBC: 4.14 Mil/uL (ref 3.87–5.11)
RDW: 13.1 % (ref 11.5–15.5)
WBC: 3.9 10*3/uL — ABNORMAL LOW (ref 4.0–10.5)

## 2021-11-07 LAB — COMPREHENSIVE METABOLIC PANEL
ALT: 23 U/L (ref 0–35)
AST: 38 U/L — ABNORMAL HIGH (ref 0–37)
Albumin: 4.3 g/dL (ref 3.5–5.2)
Alkaline Phosphatase: 77 U/L (ref 39–117)
BUN: 9 mg/dL (ref 6–23)
CO2: 32 mEq/L (ref 19–32)
Calcium: 9.4 mg/dL (ref 8.4–10.5)
Chloride: 103 mEq/L (ref 96–112)
Creatinine, Ser: 0.58 mg/dL (ref 0.40–1.20)
GFR: 103.2 mL/min (ref >60.00)
Glucose, Bld: 101 mg/dL — ABNORMAL HIGH (ref 70–99)
Potassium: 4 mEq/L (ref 3.5–5.1)
Sodium: 140 mEq/L (ref 135–145)
Total Bilirubin: 1 mg/dL (ref 0.2–1.2)
Total Protein: 7.6 g/dL (ref 6.0–8.3)

## 2021-11-07 LAB — LIPID PANEL
Cholesterol: 309 mg/dL — ABNORMAL HIGH (ref 0–200)
HDL: 45.5 mg/dL (ref >39.00)
NonHDL: 263.57
Total CHOL/HDL Ratio: 7
Triglycerides: 319 mg/dL — ABNORMAL HIGH (ref 0.0–149.0)
VLDL: 63.8 mg/dL — ABNORMAL HIGH (ref 0.0–40.0)

## 2021-11-07 LAB — LDL CHOLESTEROL, DIRECT: Direct LDL: 220 mg/dL

## 2021-11-07 NOTE — Progress Notes (Signed)
.  Subjective:    Patient ID: Sharon Blair, female    DOB: 04/26/1966, 56 y.o.   MRN: [id number]  HPI  Pt in for first time.   Pt has new insurance and she want physical today.  Pt work [place]. Pt does walk 3 days a week but not far since will cause some back and hip pain. Trying  to eat health and lose weight. No smoker. Drinks alcohol maybe 2 glasses of wine a month. 4 children. Single  She is currently not on chronic meds. But is on med rx'd for workers comp injury/bak pain and rt hip pain. Pt states pain since sept 2021.   Pt up to date on mammogram. 08-2021. Negative.  Last pap more than 3 years ago. She thinks done in 2018.  No prior colonoscopy.  Declines flu vaccine and shingles.    Review of Systems  Constitutional:  Negative for chills, fatigue and fever.  HENT:  Negative for congestion and drooling.   Respiratory:  Negative for cough, chest tightness, shortness of breath and wheezing.   Cardiovascular:  Negative for chest pain and palpitations.  Gastrointestinal:  Negative for abdominal distention, abdominal pain, blood in stool and constipation.  Genitourinary:  Negative for difficulty urinating, dysuria, enuresis, frequency and hematuria.  Musculoskeletal:  Positive for back pain. Negative for arthralgias, gait problem and joint swelling.       See hpi.     Past Medical History:  Diagnosis Date   High cholesterol      Social History   Socioeconomic History   Marital status: Single    Spouse name: Not on file   Number of children: Not on file   Years of education: Not on file   Highest education level: Not on file  Occupational History   Not on file  Tobacco Use   Smoking status: Never   Smokeless tobacco: Never  Substance and Sexual Activity   Alcohol use: Yes    Alcohol/week: 0.0 standard drinks    Comment: SOCIAL   Drug use: No   Sexual activity: Yes    Birth control/protection: Condom    Comment: .. 1st intercourse- 20, partners- 5   Other Topics Concern   Not on file  Social History Narrative   Not on file   Social Determinants of Health   Financial Resource Strain: Not on file  Food Insecurity: Not on file  Transportation Needs: Not on file  Physical Activity: Not on file  Stress: Not on file  Social Connections: Not on file  Intimate Partner Violence: Not on file    No past surgical history on file.  Family History  Problem Relation Age of Onset   Hypertension Mother    Osteoporosis Mother    Diabetes Paternal Grandfather    Breast cancer Neg Hx     Allergies  Allergen Reactions   Monurol [Fosfomycin Tromethamine] Swelling   Prednisone     No current outpatient medications on file prior to visit.   No current facility-administered medications on file prior to visit.    BP (!) 147/70    Pulse 76    Temp 98 F (36.7 C)    Resp 18    Ht 5' (1.524 m)    Wt 192 lb (87.1 kg)    SpO2 97%    BMI 37.50 kg/m       Objective:   Physical Exam  General Mental Status- Alert. General Appearance- Not in acute distress.   Skin General:  Color- Normal Color. Moisture- Normal Moisture.  Neck Carotid Arteries- Normal color. Moisture- Normal Moisture. No carotid bruits. No JVD.  Chest and Lung Exam Auscultation: Breath Sounds:-Normal.  Cardiovascular Auscultation:Rythm- Regular. Murmurs & Other Heart Sounds:Auscultation of the heart reveals- No Murmurs.  Abdomen Inspection:-Inspeection Normal. Palpation/Percussion:Note:No mass. Palpation and Percussion of the abdomen reveal- Non Tender, Non Distended + BS, no rebound or guarding.   Neurologic Cranial Nerve exam:- CN III-XII intact(No nystagmus), symmetric smile. Strength:- 5/5 equal and symmetric strength both upper and lower extremities.       Assessment & Plan:   Patient Instructions  For you wellness exam today I have ordered cbc, cmp and  lipid panel.  Vaccines declined today.  Recommend exercise and healthy diet.  We will let  you know lab results as they come in.  Follow up date appointment will be determined after lab review.     Refer for screening colonoscopy. Refer for screening pap.      Mackie Pai, PA-C

## 2021-11-07 NOTE — Patient Instructions (Addendum)
For you wellness exam today I have ordered cbc, cmp and  lipid panel.  Vaccines declined today.  Recommend exercise and healthy diet.  We will let you know lab results as they come in.  Follow up date appointment will be determined after lab review.     Refer for screening colonoscopy. Refer for screening pap.   Cuidados preventivos en las mujeres de 21 a 78 aos de edad Preventive Care 81-54 Years Old, Female Los cuidados preventivos hacen referencia a las opciones en cuanto al estilo de vida y a las visitas al mdico, las cuales pueden promover la salud y Musician. Las visitas de cuidado preventivo tambin se denominan exmenes de Therapist, sports. Qu puedo esperar para mi visita de cuidado preventivo? Asesoramiento Su mdico puede preguntarle acerca de: Antecedentes mdicos, incluidos los siguientes: Problemas mdicos pasados. Antecedentes mdicos familiares. Antecedentes de embarazo. Salud actual, incluido lo siguiente: [place] menstrual. Mtodos anticonceptivos. Su bienestar emocional. Restaurant manager, fast food y las relaciones personales. Actividad sexual y salud sexual. Kary Kos de vida, incluido lo siguiente: Consumo de alcohol, nicotina, tabaco o drogas. Acceso a armas de fuego. Hbitos de alimentacin, ejercicio y sueo. Su trabajo y [place] laboral. Uso de [place] solar. Cuestiones de seguridad, como el uso de cinturn de seguridad y casco de Secondary school teacher. Examen fsico El mdico revisar lo siguiente: IT consultant y [place]. Estos pueden usarse para calcular el IMC (ndice de masa corporal). El [facility] es una medicin que indica si tiene un peso saludable. Circunferencia de la cintura. Es Ardelia Mems medicin alrededor de Science writer. Esta medicin tambin indica si tiene un peso saludable y puede ayudar a predecir su riesgo de padecer ciertas enfermedades, como diabetes tipo 2 y presin arterial alta. Frecuencia cardaca y presin arterial. Temperatura corporal. Piel para detectar manchas  anormales. Qu vacunas necesito? Las vacunas se aplican a varias edades, segn un cronograma. El [employer] recomendar vacunas segn su edad, sus antecedentes mdicos, su estilo de vida y otros factores, como los viajes o el lugar donde trabaja. Qu pruebas necesito? Pruebas de deteccin El mdico puede recomendar pruebas de deteccin de ciertas afecciones. Esto puede incluir: Niveles de lpidos y colesterol. Pruebas de deteccin de la diabetes. Esto se Set designer un control del azcar en la sangre (glucosa) despus de no haber comido durante un periodo de tiempo (ayuno). Examen plvico y prueba de Papanicolaou. Prueba de hepatitis B. Prueba de hepatitis C. Prueba del VIH (virus de inmunodeficiencia humana). Pruebas de infecciones de transmisin sexual (ITS), si est en riesgo. Pruebas de deteccin de cncer de pulmn. Pruebas de deteccin de cncer colorrectal. Mamografa. Hable con su mdico sobre cundo debe comenzar a Engineer, manufacturing de [place] regular. Esto depende de si tiene antecedentes familiares de cncer de mama o no. Pruebas de deteccin de cncer relacionado con las mutaciones del BRCA. Es posible que se las deba realizar si tiene antecedentes de cncer de mama, de ovario, de trompas o peritoneal. Densitometra sea. Esto se realiza para detectar osteoporosis. Hable con su mdico [employer], las opciones de tratamiento y, si corresponde, la necesidad de Optometrist ms pruebas. Siga estas instrucciones en su casa: Comida y bebida  Siga una dieta que incluya frutas y verduras frescas, cereales integrales, protenas magras y productos lcteos descremados. Tome los suplementos vitamnicos y [employer] se lo haya indicado el mdico. No beba alcohol si: Su mdico le indica no hacerlo. Est embarazada, puede estar embarazada o est tratando de [place]. Si bebe alcohol: Limite la cantidad  que consume de 0 a 1 medida por da. Sepa  cunta cantidad de alcohol hay en las bebidas que toma. En los [place], una medida equivale a una botella de cerveza de 12 oz (355 ml), un vaso de vino de 5 oz (148 ml) o un vaso de una bebida alcohlica de alta graduacin de 1 oz (44 ml). Estilo de [employer] dientes a la maana y a la noche con Social research officer, government con fluoruro. Use hilo dental una vez al da. Haga al menos 30 minutos de ejercicio, 5 o ms das cada semana. No consuma ningn producto que contenga nicotina o tabaco. Estos productos incluyen cigarrillos, tabaco para Higher education careers adviser y aparatos de vapeo, como los Psychologist, sport and exercise. Si necesita ayuda para dejar de fumar, consulte al mdico. No consuma drogas. Si es sexualmente activa, practique sexo seguro. Use un condn u otra forma de proteccin para prevenir las infecciones de transmisin sexual (ITS). Si no desea quedar embarazada, use un mtodo anticonceptivo. Si busca un embarazo, realice una consulta previa al [employer] con el mdico. Tome aspirina nicamente como se lo haya indicado el mdico. Asegrese de que comprende qu cantidad y cul presentacin debe tomar. Trabaje con el mdico para averiguar si es seguro y beneficioso para usted tomar aspirina a diario. Busque maneras saludables de Scientific laboratory technician, tales como: Meditacin, yoga o Conservation officer, nature. Lleve un diario personal. Hable con una persona confiable. Pase tiempo con amigos y familiares. Minimice la exposicin a la radiacin UV para reducir el riesgo de cncer de piel. Seguridad [place] siempre el cinturn de seguridad al conducir o viajar en un vehculo. No conduzca: Si ha estado bebiendo alcohol. No viaje con un conductor que ha estado bebiendo. Si est cansada o distrada. Mientras est enviando mensajes de texto. Si ha estado usando sustancias o drogas que alteran la funcin mental. Use un casco y otros equipos de proteccin durante las actividades deportivas. Si tiene armas de fuego en su casa,  asegrese de seguir todos los procedimientos de seguridad correspondientes. Busque ayuda si fue vctima de abuso fsico o abuso sexual. Cundo volver? Visite al mdico una vez al ao para una visita anual de control de bienestar. Pregntele al mdico con qu frecuencia debe realizarse un control de la vista y los dientes. Mantenga su esquema de vacunacin al da. Esta informacin no tiene Marine scientist el consejo del mdico. Asegrese de hacerle al mdico cualquier pregunta que tenga. Document Revised: 03/17/2021 Document Reviewed: 03/17/2021 Elsevier Patient Education  [place].

## 2022-06-27 ENCOUNTER — Ambulatory Visit (INDEPENDENT_AMBULATORY_CARE_PROVIDER_SITE_OTHER): Payer: Commercial Managed Care - HMO | Admitting: Medical

## 2022-06-27 VITALS — BP 120/70 | HR 73 | Temp 98.2°F | Resp 18 | Ht 60.0 in | Wt 193.6 lb

## 2022-06-27 DIAGNOSIS — R6884 Jaw pain: Secondary | ICD-10-CM

## 2022-06-27 DIAGNOSIS — M272 Inflammatory conditions of jaws: Secondary | ICD-10-CM | POA: Diagnosis not present

## 2022-06-27 MED ORDER — AMOXICILLIN-POT CLAVULANATE 875-125 MG PO TABS: 1.0000 | ORAL_TABLET | Freq: Two times a day (BID) | ORAL | 0 refills | Status: DC

## 2022-06-27 MED ORDER — HYDROCODONE-ACETAMINOPHEN 5-325 MG PO TABS: 1.0000 | ORAL_TABLET | Freq: Four times a day (QID) | ORAL | 0 refills | Status: DC | PRN

## 2022-06-27 NOTE — Patient Instructions (Addendum)
By exam and history appears tooth infection causing jaw pain. Rx augmentin antibiotic. Will give limited number of norco 5/325 to use every 6 hours severe pain. But presently recommend rely on celbrex that ortho prescribed and norco for break thru pain. Not to use ibuprofen and celebrex at same time as they are in same class.  Recommend be seen by dentist. Try to contact your prior dentis. Also Dental works might be option. Number is [phone].   If signs and symptoms worsen before you see dentist then can be seen in ED.

## 2022-06-27 NOTE — Progress Notes (Signed)
Subjective:    Patient ID: Sharon Blair, female    DOB: May 27, 1966, 56 y.o.   MRN: [id number]  HPI  Saturday in afternoon got facial pain left side with some swelling over lower jaw. Pt has slight subjective fever. Occasional chill.  Pt states when chews has more pain.  Review of Systems  Constitutional:  Negative for chills, fatigue and fever.  HENT:         Left lower jaw pain.  Respiratory:  Negative for cough, chest tightness, shortness of breath and wheezing.   Cardiovascular:  Negative for chest pain and palpitations.  Gastrointestinal:  Negative for abdominal pain.  Genitourinary:  Negative for dysuria.   Past Medical History:  Diagnosis Date   High cholesterol      Social History   Socioeconomic History   Marital status: Single    Spouse name: Not on file   Number of children: Not on file   Years of education: Not on file   Highest education level: Not on file  Occupational History   Not on file  Tobacco Use   Smoking status: Never   Smokeless tobacco: Never  Vaping Use   Vaping Use: Never used  Substance and Sexual Activity   Alcohol use: Yes    Alcohol/week: 2.0 standard drinks of alcohol    Types: 2 Standard drinks or equivalent per week    Comment: SOCIAL. glasses of wine a month.   Drug use: No   Sexual activity: Yes    Birth control/protection: Condom    Comment: .. 1st intercourse- 20, partners- 5  Other Topics Concern   Not on file  Social History Narrative   Not on file   Social Determinants of Health   Financial Resource Strain: Not on file  Food Insecurity: Not on file  Transportation Needs: Not on file  Physical Activity: Not on file  Stress: Not on file  Social Connections: Not on file  Intimate Partner Violence: Not on file    No past surgical history on file.  Family History  Problem Relation Age of Onset   Hypertension Mother    Osteoporosis Mother    Diabetes Paternal Grandfather    Breast cancer Neg Hx      Allergies  Allergen Reactions   Monurol [Fosfomycin Tromethamine] Swelling   Prednisone     No current outpatient medications on file prior to visit.   No current facility-administered medications on file prior to visit.    BP 120/70   Pulse 73   Temp 98.2 F (36.8 C)   Resp 18   Ht 5' (1.524 m)   Wt 193 lb 9.6 oz (87.8 kg)   SpO2 100%   BMI 37.81 kg/m        Objective:   Physical Exam  General- No acute distress. Pleasant patient. Neck- Full range of motion, no jvd Lungs- Clear, even and unlabored. Heart- regular rate and rhythm. Neurologic- CNII- XII grossly intact.   Heent- no sinus pressure. Left lower jaw/mandible area mild swollen and tender to palpation. On inspection back molar left lower tooth has filling. This tooth is tender to palpation. 3 teeeth or more missing left lower side.        Assessment & Plan:   Patient Instructions  By exam and history appears tooth infection causing jaw pain. Rx augmentin antibiotic. Will give limited number of norco 5/325 to use every 6 hours severe pain. But presently recommend rely on celbrex that ortho prescribed and norco  for break thru pain. Not to use ibuprofen and celebrex at same time as they are in same class.  Recommend be seen by dentist. Try to contact your prior dentis. Also Dental works might be option. Number is [phone].   If signs and symptoms worsen before you see dentist then can be seen in ED.    Mackie Pai, PA-C

## 2022-07-12 DIAGNOSIS — Z419 Encounter for procedure for purposes other than remedying health state, unspecified: Secondary | ICD-10-CM | POA: Diagnosis not present

## 2022-08-06 DIAGNOSIS — H5213 Myopia, bilateral: Secondary | ICD-10-CM | POA: Diagnosis not present

## 2022-08-11 DIAGNOSIS — Z419 Encounter for procedure for purposes other than remedying health state, unspecified: Secondary | ICD-10-CM | POA: Diagnosis not present

## 2022-09-11 DIAGNOSIS — Z419 Encounter for procedure for purposes other than remedying health state, unspecified: Secondary | ICD-10-CM | POA: Diagnosis not present

## 2022-10-12 DIAGNOSIS — Z419 Encounter for procedure for purposes other than remedying health state, unspecified: Secondary | ICD-10-CM | POA: Diagnosis not present

## 2022-11-08 ENCOUNTER — Ambulatory Visit (INDEPENDENT_AMBULATORY_CARE_PROVIDER_SITE_OTHER): Payer: Medicaid Other | Admitting: Medical

## 2022-11-08 ENCOUNTER — Encounter: Payer: Commercial Managed Care - HMO | Admitting: Medical

## 2022-11-08 VITALS — BP 124/60 | HR 76 | Temp 98.0°F | Resp 18 | Ht 60.0 in | Wt 190.0 lb

## 2022-11-08 DIAGNOSIS — Z0000 Encounter for general adult medical examination without abnormal findings: Secondary | ICD-10-CM | POA: Diagnosis not present

## 2022-11-08 DIAGNOSIS — Z1211 Encounter for screening for malignant neoplasm of colon: Secondary | ICD-10-CM | POA: Diagnosis not present

## 2022-11-08 DIAGNOSIS — Z1231 Encounter for screening mammogram for malignant neoplasm of breast: Secondary | ICD-10-CM

## 2022-11-08 DIAGNOSIS — E559 Vitamin D deficiency, unspecified: Secondary | ICD-10-CM

## 2022-11-08 DIAGNOSIS — Z124 Encounter for screening for malignant neoplasm of cervix: Secondary | ICD-10-CM

## 2022-11-08 DIAGNOSIS — R739 Hyperglycemia, unspecified: Secondary | ICD-10-CM | POA: Diagnosis not present

## 2022-11-08 DIAGNOSIS — Z111 Encounter for screening for respiratory tuberculosis: Secondary | ICD-10-CM

## 2022-11-08 LAB — COMPREHENSIVE METABOLIC PANEL
ALT: 59 U/L — ABNORMAL HIGH (ref 0–35)
AST: 65 U/L — ABNORMAL HIGH (ref 0–37)
Albumin: 3.9 g/dL (ref 3.5–5.2)
Alkaline Phosphatase: 83 U/L (ref 39–117)
BUN: 9 mg/dL (ref 6–23)
CO2: 28 mEq/L (ref 19–32)
Calcium: 9.5 mg/dL (ref 8.4–10.5)
Chloride: 104 mEq/L (ref 96–112)
Creatinine, Ser: 0.58 mg/dL (ref 0.40–1.20)
GFR: 102.48 mL/min (ref >60.00)
Glucose, Bld: 88 mg/dL (ref 70–99)
Potassium: 3.8 mEq/L (ref 3.5–5.1)
Sodium: 140 mEq/L (ref 135–145)
Total Bilirubin: 0.9 mg/dL (ref 0.2–1.2)
Total Protein: 7.4 g/dL (ref 6.0–8.3)

## 2022-11-08 LAB — CBC WITH DIFFERENTIAL/PLATELET
Basophils Absolute: 0 10*3/uL (ref 0.0–0.1)
Basophils Relative: 0.6 % (ref 0.0–3.0)
Eosinophils Absolute: 0.1 10*3/uL (ref 0.0–0.7)
Eosinophils Relative: 2.7 % (ref 0.0–5.0)
HCT: 40.9 % (ref 36.0–46.0)
Hemoglobin: 14.1 g/dL (ref 12.0–15.0)
Lymphocytes Relative: 39 % (ref 12.0–46.0)
Lymphs Abs: 1.9 10*3/uL (ref 0.7–4.0)
MCHC: 34.5 g/dL (ref 30.0–36.0)
MCV: 97.7 fl (ref 78.0–100.0)
Monocytes Absolute: 0.2 10*3/uL (ref 0.1–1.0)
Monocytes Relative: 5.2 % (ref 3.0–12.0)
Neutro Abs: 2.5 10*3/uL (ref 1.4–7.7)
Neutrophils Relative %: 52.5 % (ref 43.0–77.0)
Platelets: 198 10*3/uL (ref 150.0–400.0)
RBC: 4.18 Mil/uL (ref 3.87–5.11)
RDW: 12.7 % (ref 11.5–15.5)
WBC: 4.7 10*3/uL (ref 4.0–10.5)

## 2022-11-08 LAB — VITAMIN D 25 HYDROXY (VIT D DEFICIENCY, FRACTURES): VITD: 15.38 ng/mL — ABNORMAL LOW (ref 30.00–100.00)

## 2022-11-08 LAB — LIPID PANEL
Cholesterol: 264 mg/dL — ABNORMAL HIGH (ref 0–200)
HDL: 45.9 mg/dL (ref >39.00)
NonHDL: 218.17
Total CHOL/HDL Ratio: 6
Triglycerides: 208 mg/dL — ABNORMAL HIGH (ref 0.0–149.0)
VLDL: 41.6 mg/dL — ABNORMAL HIGH (ref 0.0–40.0)

## 2022-11-08 LAB — LDL CHOLESTEROL, DIRECT: Direct LDL: 202 mg/dL

## 2022-11-08 LAB — HEMOGLOBIN A1C: Hgb A1c MFr Bld: 5.5 % (ref 4.6–6.5)

## 2022-11-08 MED ORDER — VITAMIN D (ERGOCALCIFEROL) 1.25 MG (50000 UNIT) PO CAPS: 50000.0000 [IU] | ORAL_CAPSULE | ORAL | 0 refills | Status: AC

## 2022-11-08 NOTE — Addendum Note (Signed)
Addended by: Anabel Halon on: 11/08/2022 06:15 PM   Modules accepted: Orders

## 2022-11-08 NOTE — Progress Notes (Signed)
Subjective:    Patient ID: Sharon Blair, female    DOB: 1966/07/16, 57 y.o.   MRN: [id number]  HPI  Pt states not employed since December(Trying to find new job). Pt does walk 2-3 days a week.Trying to eat health and lose weight. No smoker. Drinks alcohol maybe 2 glasses of wine a month/social. 4 children. Single    Pt never had colonoscopy. Placed referral last year. Office called and no response per review.  Gyn- referral placed for pap last year. Pt did not go.   Needs mammogram. No hx of abnormal mammogram.  Advised can get shingrix thru pharmacy.   Hx of vit D deficiency.  Review of Systems  Constitutional:  Negative for chills, fatigue and fever.  HENT:  Negative for congestion, ear discharge and ear pain.   Respiratory:  Negative for cough, chest tightness, shortness of breath and wheezing.   Cardiovascular:  Negative for chest pain and palpitations.  Gastrointestinal:  Negative for abdominal pain, blood in stool, nausea and vomiting.  Genitourinary:  Negative for dysuria.  Musculoskeletal:  Negative for back pain and joint swelling.  Skin:  Negative for rash.  Neurological:  Negative for dizziness, light-headedness, numbness and headaches.  Hematological:  Negative for adenopathy. Does not bruise/bleed easily.  Psychiatric/Behavioral:  Negative for behavioral problems and confusion.     Past Medical History:  Diagnosis Date   High cholesterol      Social History   Socioeconomic History   Marital status: Single    Spouse name: Not on file   Number of children: Not on file   Years of education: Not on file   Highest education level: Not on file  Occupational History   Not on file  Tobacco Use   Smoking status: Never   Smokeless tobacco: Never  Vaping Use   Vaping Use: Never used  Substance and Sexual Activity   Alcohol use: Yes    Alcohol/week: 2.0 standard drinks of alcohol    Types: 2 Standard drinks or equivalent per week    Comment: SOCIAL.  glasses of wine a month.   Drug use: No   Sexual activity: Yes    Birth control/protection: Condom    Comment: .. 1st intercourse- 20, partners- 5  Other Topics Concern   Not on file  Social History Narrative   Not on file   Social Determinants of Health   Financial Resource Strain: Not on file  Food Insecurity: Not on file  Transportation Needs: Not on file  Physical Activity: Not on file  Stress: Not on file  Social Connections: Not on file  Intimate Partner Violence: Not on file    No past surgical history on file.  Family History  Problem Relation Age of Onset   Hypertension Mother    Osteoporosis Mother    Diabetes Paternal Grandfather    Breast cancer Neg Hx     Allergies  Allergen Reactions   Monurol [Fosfomycin Tromethamine] Swelling   Prednisone     No current outpatient medications on file prior to visit.   No current facility-administered medications on file prior to visit.    BP 124/60   Pulse 76   Temp 98 F (36.7 C)   Resp 18   Ht 5' (1.524 m)   Wt 190 lb (86.2 kg)   SpO2 98%   BMI 37.11 kg/m         Objective:   Physical Exam  General Mental Status- Alert. General Appearance- Not in acute  distress.   Skin General: Color- Normal Color. Moisture- Normal Moisture.  Neck Carotid Arteries- Normal color. Moisture- Normal Moisture. No carotid bruits. No JVD.  Chest and Lung Exam Auscultation: Breath Sounds:-Normal.  Cardiovascular Auscultation:Rythm- Regular. Murmurs & Other Heart Sounds:Auscultation of the heart reveals- No Murmurs.  Abdomen Inspection:-Inspeection Normal. Palpation/Percussion:Note:No mass. Palpation and Percussion of the abdomen reveal- Non Tender, Non Distended + BS, no rebound or guarding.  Neurologic Cranial Nerve exam:- CN III-XII intact(No nystagmus), symmetric smile. Strength:- 5/5 equal and symmetric strength both upper and lower extremities.       Assessment & Plan:   Patient Instructions  For  you wellness exam today I have ordered cbc, A1c cmp and lipid panel.  Shingrix thru pharmacy.  Placed referral to gyn for screening cervical cancer/pap. Placed referral to gastroenterologist for screening colnooscopy. Mammogram order placed.  Recommend exercise and healthy diet.  We will let you know lab results as they come in.  Follow up date appointment will be determined after lab review.      Mackie Pai, PA-C

## 2022-11-08 NOTE — Patient Instructions (Addendum)
For you wellness exam today I have ordered cbc, A1c cmp and lipid panel.  Shingrix thru pharmacy.  Placed referral to gyn for screening cervical cancer/pap. Placed referral to gastroenterologist for screening colonoscopy. Mammogram order placed.  Recommend exercise and healthy diet.  We will let you know lab results as they come in.  Follow up date appointment will be determined after lab review.    Preventive Care 72-55 Years Old, Female Preventive care refers to lifestyle choices and visits with your health care provider that can promote health and wellness. Preventive care visits are also called wellness exams. What can I expect for my preventive care visit? Counseling Your health care provider may ask you questions about your: Medical history, including: Past medical problems. Family medical history. Pregnancy history. Current health, including: Menstrual cycle. Method of birth control. Emotional well-being. Home life and relationship well-being. Sexual activity and sexual health. Lifestyle, including: Alcohol, nicotine or tobacco, and drug use. Access to firearms. Diet, exercise, and sleep habits. Work and work Statistician. Sunscreen use. Safety issues such as seatbelt and bike helmet use. Physical exam Your health care provider will check your: Height and weight. These may be used to calculate your BMI (body mass index). BMI is a measurement that tells if you are at a healthy weight. Waist circumference. This measures the distance around your waistline. This measurement also tells if you are at a healthy weight and may help predict your risk of certain diseases, such as type 2 diabetes and high blood pressure. Heart rate and blood pressure. Body temperature. Skin for abnormal spots. What immunizations do I need?  Vaccines are usually given at various ages, according to a schedule. Your health care provider will recommend vaccines for you based on your age, medical  history, and lifestyle or other factors, such as travel or where you work. What tests do I need? Screening Your health care provider may recommend screening tests for certain conditions. This may include: Lipid and cholesterol levels. Diabetes screening. This is done by checking your blood sugar (glucose) after you have not eaten for a while (fasting). Pelvic exam and Pap test. Hepatitis B test. Hepatitis C test. HIV (human immunodeficiency virus) test. STI (sexually transmitted infection) testing, if you are at risk. Lung cancer screening. Colorectal cancer screening. Mammogram. Talk with your health care provider about when you should start having regular mammograms. This may depend on whether you have a family history of breast cancer. BRCA-related cancer screening. This may be done if you have a family history of breast, ovarian, tubal, or peritoneal cancers. Bone density scan. This is done to screen for osteoporosis. Talk with your health care provider about your test results, treatment options, and if necessary, the need for more tests. Follow these instructions at home: Eating and drinking  Eat a diet that includes fresh fruits and vegetables, whole grains, lean protein, and low-fat dairy products. Take vitamin and mineral supplements as recommended by your health care provider. Do not drink alcohol if: Your health care provider tells you not to drink. You are pregnant, may be pregnant, or are planning to become pregnant. If you drink alcohol: Limit how much you have to 0-1 drink a day. Know how much alcohol is in your drink. In the [place] one drink equals one 12 oz bottle of beer (355 mL), one 5 oz glass of wine (148 mL), or one 1 oz glass of hard liquor (44 mL). Lifestyle Brush your teeth every morning and night with fluoride toothpaste. Floss  one time each day. Exercise for at least 30 minutes 5 or more days each week. Do not use any products that contain nicotine or tobacco.  These products include cigarettes, chewing tobacco, and vaping devices, such as e-cigarettes. If you need help quitting, ask your health care provider. Do not use drugs. If you are sexually active, practice safe sex. Use a condom or other form of protection to prevent STIs. If you do not wish to become pregnant, use a form of birth control. If you plan to become pregnant, see your health care provider for a prepregnancy visit. Take aspirin only as told by your health care provider. Make sure that you understand how much to take and what form to take. Work with your health care provider to find out whether it is safe and beneficial for you to take aspirin daily. Find healthy ways to manage stress, such as: Meditation, yoga, or listening to music. Journaling. Talking to a trusted person. Spending time with friends and family. Minimize exposure to UV radiation to reduce your risk of skin cancer. Safety Always wear your seat belt while driving or riding in a vehicle. Do not drive: If you have been drinking alcohol. Do not ride with someone who has been drinking. When you are tired or distracted. While texting. If you have been using any mind-altering substances or drugs. Wear a helmet and other protective equipment during sports activities. If you have firearms in your house, make sure you follow all gun safety procedures. Seek help if you have been physically or sexually abused. What's next? Visit your health care provider once a year for an annual wellness visit. Ask your health care provider how often you should have your eyes and teeth checked. Stay up to date on all vaccines. This information is not intended to replace advice given to you by your health care provider. Make sure you discuss any questions you have with your health care provider. Document Revised: 02/23/2021 Document Reviewed: 02/23/2021 Elsevier Patient Education  [place].

## 2022-11-10 DIAGNOSIS — Z419 Encounter for procedure for purposes other than remedying health state, unspecified: Secondary | ICD-10-CM | POA: Diagnosis not present

## 2022-11-13 ENCOUNTER — Ambulatory Visit (HOSPITAL_BASED_OUTPATIENT_CLINIC_OR_DEPARTMENT_OTHER)
Admission: RE | Admit: 2022-11-13 | Discharge: 2022-11-13 | Disposition: A | Discharge status: Home / Self Care | Payer: Medicaid Other | Source: Ambulatory Visit | Attending: Medical | Admitting: Medical

## 2022-11-13 ENCOUNTER — Encounter: Payer: Self-pay | Admitting: General Practice

## 2022-11-13 ENCOUNTER — Encounter (HOSPITAL_BASED_OUTPATIENT_CLINIC_OR_DEPARTMENT_OTHER): Payer: Self-pay

## 2022-11-13 DIAGNOSIS — Z1231 Encounter for screening mammogram for malignant neoplasm of breast: Secondary | ICD-10-CM | POA: Diagnosis not present

## 2022-11-14 DIAGNOSIS — M5451 Vertebrogenic low back pain: Secondary | ICD-10-CM | POA: Diagnosis not present

## 2022-11-21 ENCOUNTER — Telehealth: Payer: Self-pay | Admitting: Medical

## 2022-11-21 NOTE — Telephone Encounter (Signed)
Per lab tb gold takes up to a week. Patient needs results this week. Was scheduled to come in tomorrow for TB placement and Friday for TB read.

## 2022-11-21 NOTE — Telephone Encounter (Signed)
Okay for pt to come in for tb test

## 2022-11-21 NOTE — Telephone Encounter (Signed)
Patient would like to know if she could get an order for a TB skin test for work. Please advise.

## 2022-11-21 NOTE — Addendum Note (Signed)
Addended by: Anabel Halon on: 11/21/2022 02:33 PM   Modules accepted: Orders

## 2022-11-22 ENCOUNTER — Ambulatory Visit (INDEPENDENT_AMBULATORY_CARE_PROVIDER_SITE_OTHER): Payer: Medicaid Other

## 2022-11-22 DIAGNOSIS — Z111 Encounter for screening for respiratory tuberculosis: Secondary | ICD-10-CM

## 2022-11-22 NOTE — Progress Notes (Signed)
Patient here today for PPD skin test , left arm , no wheal

## 2022-11-24 ENCOUNTER — Ambulatory Visit (INDEPENDENT_AMBULATORY_CARE_PROVIDER_SITE_OTHER): Payer: Medicaid Other

## 2022-11-24 DIAGNOSIS — Z111 Encounter for screening for respiratory tuberculosis: Secondary | ICD-10-CM

## 2022-11-24 LAB — TB SKIN TEST
Induration: 0 mm
TB Skin Test: NEGATIVE

## 2022-11-24 NOTE — Progress Notes (Signed)
Patient here today for a TB read. Results are negative left forearm

## 2022-12-11 DIAGNOSIS — Z419 Encounter for procedure for purposes other than remedying health state, unspecified: Secondary | ICD-10-CM | POA: Diagnosis not present

## 2023-01-03 ENCOUNTER — Ambulatory Visit (INDEPENDENT_AMBULATORY_CARE_PROVIDER_SITE_OTHER): Payer: Medicaid Other | Admitting: Family Medicine

## 2023-01-03 ENCOUNTER — Other Ambulatory Visit (HOSPITAL_COMMUNITY)
Admission: RE | Admit: 2023-01-03 | Discharge: 2023-01-03 | Disposition: A | Discharge status: Home / Self Care | Payer: Medicaid Other | Source: Ambulatory Visit | Attending: Family Medicine | Admitting: Family Medicine

## 2023-01-03 ENCOUNTER — Encounter: Payer: Self-pay | Admitting: Family Medicine

## 2023-01-03 VITALS — BP 133/74 | HR 72 | Ht 60.0 in | Wt 194.0 lb

## 2023-01-03 DIAGNOSIS — Z01419 Encounter for gynecological examination (general) (routine) without abnormal findings: Secondary | ICD-10-CM | POA: Diagnosis not present

## 2023-01-03 DIAGNOSIS — Z1211 Encounter for screening for malignant neoplasm of colon: Secondary | ICD-10-CM

## 2023-01-03 NOTE — Progress Notes (Signed)
   ANNUAL EXAM Patient name: Sharon Blair MRN [id number]  Date of birth: 04-18-1966 Chief Complaint:   Annual Exam  History of Present Illness:   Sharon Blair is a 57 y.o.  [phone]  female  being seen today for a routine annual exam.  Current complaints: none. Menopausal. Mild vasomotor symptoms.  Patient's last menstrual period was 11/10/2016.    Last pap >3 years ago.  H/O abnormal pap: no Last mammogram: 11/2022. Results were: normal. Family h/o breast cancer: no Last colonoscopy: n/a     11/07/2021    8:31 AM  Depression screen PHQ 2/9  Decreased Interest 0  Down, Depressed, Hopeless 0  PHQ - 2 Score 0         No data to display           Review of Systems:   Pertinent items are noted in HPI Denies any headaches, blurred vision, fatigue, shortness of breath, chest pain, abdominal pain, abnormal vaginal discharge/itching/odor/irritation, problems with periods, bowel movements, urination, or intercourse unless otherwise stated above. Pertinent History Reviewed:  Reviewed past medical,surgical, social and family history.  Reviewed problem list, medications and allergies. Physical Assessment:   Vitals:   01/03/23 0850  BP: 133/74  Pulse: 72  Weight: 194 lb (88 kg)  Height: 5' (1.524 m)  Body mass index is 37.89 kg/m.        Physical Examination:   General appearance - well appearing, and in no distress  Mental status - alert, oriented to person, place, and time  Psych:  She has a normal mood and affect  Skin - warm and dry, normal color, no suspicious lesions noted  Chest - effort normal, all lung fields clear to auscultation bilaterally  Heart - normal rate and regular rhythm  Neck:  midline trachea, no thyromegaly or nodules  Breasts - breasts appear normal, no suspicious masses, no skin or nipple changes or axillary nodes  Abdomen - soft, nontender, nondistended, no masses or organomegaly  Pelvic - VULVA: normal appearing vulva with no  masses, tenderness or lesions  VAGINA: normal appearing vagina with normal color and discharge, no lesions  CERVIX: normal appearing cervix without discharge or lesions, no CMT  Thin prep pap is done with HR HPV cotesting  UTERUS: uterus is felt to be normal size, shape, consistency and nontender   ADNEXA: No adnexal masses or tenderness noted.  Extremities:  No swelling or varicosities noted  Chaperone present for exam  Assessment & Plan:  1. Well woman exam with routine gynecological exam - Cytology - PAP( [place])  2. Screening for colon cancer - Ambulatory referral to Gastroenterology    No orders of the defined types were placed in this encounter.   Meds: No orders of the defined types were placed in this encounter.   Follow-up: No follow-ups on file.  Levie Heritage, DO 01/03/2023 9:15 AM

## 2023-01-05 LAB — CYTOLOGY - PAP
Comment: NEGATIVE
Diagnosis: NEGATIVE
High risk HPV: NEGATIVE

## 2023-01-10 DIAGNOSIS — Z419 Encounter for procedure for purposes other than remedying health state, unspecified: Secondary | ICD-10-CM | POA: Diagnosis not present

## 2023-02-10 DIAGNOSIS — Z419 Encounter for procedure for purposes other than remedying health state, unspecified: Secondary | ICD-10-CM | POA: Diagnosis not present

## 2023-03-12 DIAGNOSIS — Z419 Encounter for procedure for purposes other than remedying health state, unspecified: Secondary | ICD-10-CM | POA: Diagnosis not present

## 2023-04-12 DIAGNOSIS — Z419 Encounter for procedure for purposes other than remedying health state, unspecified: Secondary | ICD-10-CM | POA: Diagnosis not present

## 2023-05-13 DIAGNOSIS — Z419 Encounter for procedure for purposes other than remedying health state, unspecified: Secondary | ICD-10-CM | POA: Diagnosis not present

## 2023-06-12 DIAGNOSIS — Z419 Encounter for procedure for purposes other than remedying health state, unspecified: Secondary | ICD-10-CM | POA: Diagnosis not present

## 2023-06-15 ENCOUNTER — Other Ambulatory Visit: Payer: Self-pay | Admitting: Medical

## 2023-06-15 DIAGNOSIS — Z1212 Encounter for screening for malignant neoplasm of rectum: Secondary | ICD-10-CM

## 2023-06-15 DIAGNOSIS — Z1211 Encounter for screening for malignant neoplasm of colon: Secondary | ICD-10-CM

## 2023-07-13 DIAGNOSIS — Z419 Encounter for procedure for purposes other than remedying health state, unspecified: Secondary | ICD-10-CM | POA: Diagnosis not present

## 2023-08-12 DIAGNOSIS — Z419 Encounter for procedure for purposes other than remedying health state, unspecified: Secondary | ICD-10-CM | POA: Diagnosis not present

## 2023-08-13 DIAGNOSIS — K029 Dental caries, unspecified: Secondary | ICD-10-CM | POA: Diagnosis not present

## 2023-08-16 DIAGNOSIS — Z0120 Encounter for dental examination and cleaning without abnormal findings: Secondary | ICD-10-CM | POA: Diagnosis not present

## 2023-09-03 ENCOUNTER — Ambulatory Visit
Admission: RE | Admit: 2023-09-03 | Discharge: 2023-09-03 | Disposition: A | Discharge status: Home / Self Care | Payer: Medicaid Other | Source: Ambulatory Visit | Attending: Internal Medicine | Admitting: Internal Medicine

## 2023-09-03 VITALS — BP 137/78 | HR 97 | Temp 99.5°F | Resp 15

## 2023-09-03 DIAGNOSIS — J069 Acute upper respiratory infection, unspecified: Secondary | ICD-10-CM | POA: Diagnosis not present

## 2023-09-03 MED ORDER — AZITHROMYCIN 250 MG PO TABS: ORAL_TABLET | ORAL | 0 refills | Status: DC

## 2023-09-03 MED ORDER — PREDNISONE 10 MG PO TABS: 40.0000 mg | ORAL_TABLET | Freq: Every day | ORAL | 0 refills | Status: AC

## 2023-09-03 NOTE — ED Provider Notes (Signed)
[facility]    CSN: [id number] Arrival date & time: 09/03/23  1343      History   Chief Complaint Chief Complaint  Patient presents with   Cough    Alot congestion green phlegm, upper stomack pain - Entered by patient    HPI Sharon Blair is a 57 y.o. female.   57 year old female who presents to urgent care with complaints of cough, chills, congestion and lots of mucus production.  This started Monday last week and has worsened.  She has some decrease in her appetite as well.  She has not had any fevers but is having chills.  She reports at night she has a lot of mucus production and has to go to the sink to cough it up.  She denies shortness of breath or chest pain.  She is having some muscle spasm with coughing.  Her sister was recently diagnosed with bronchitis.   Cough Associated symptoms: no chest pain, no chills, no ear pain, no fever, no rash, no shortness of breath and no sore throat     Past Medical History:  Diagnosis Date   High cholesterol     Patient Active Problem List   Diagnosis Date Noted   Mixed hyperlipidemia 12/11/2016   Headache 06/18/2013   H/O vitamin D deficiency 11/08/2012   Weight gain 11/08/2012   Hair loss 11/08/2012    History reviewed. No pertinent surgical history.  OB History     Gravida  6   Para  4   Term  4   Preterm      AB  1   Living  4      SAB  1   IAB      Ectopic      Multiple      Live Births  4            Home Medications    Prior to Admission medications   Medication Sig Start Date End Date Taking? Authorizing Provider  Vitamin D, Ergocalciferol, (DRISDOL) 1.25 MG (50000 UNIT) CAPS capsule Take 1 capsule (50,000 Units total) by mouth every 7 (seven) days. 11/08/22   Saguier, Ramon Dredge, PA-C    Family History Family History  Problem Relation Age of Onset   Hypertension Mother    Osteoporosis Mother    Diabetes Paternal Grandfather    Breast cancer Neg Hx     Social  History Social History   Tobacco Use   Smoking status: Never   Smokeless tobacco: Never  Vaping Use   Vaping status: Never Used  Substance Use Topics   Alcohol use: Yes    Alcohol/week: 2.0 standard drinks of alcohol    Types: 2 Standard drinks or equivalent per week    Comment: SOCIAL. glasses of wine a month.   Drug use: No     Allergies   Fosfomycin, Monurol [fosfomycin tromethamine], and Prednisone   Review of Systems Review of Systems  Constitutional:  Negative for chills and fever.  HENT:  Positive for congestion. Negative for ear pain and sore throat.   Eyes:  Negative for pain and visual disturbance.  Respiratory:  Positive for cough (With mucus production) and chest tightness. Negative for shortness of breath.   Cardiovascular:  Negative for chest pain and palpitations.  Gastrointestinal:  Negative for abdominal pain and vomiting.  Genitourinary:  Negative for dysuria and hematuria.  Musculoskeletal:  Negative for arthralgias and back pain.  Skin:  Negative for color change and rash.  Neurological:  Negative for seizures and syncope.  All other systems reviewed and are negative.    Physical Exam Triage Vital Signs ED Triage Vitals  Encounter Vitals Group     BP 09/03/23 1350 137/78     Systolic BP Percentile --      Diastolic BP Percentile --      Pulse Rate 09/03/23 1349 97     Resp 09/03/23 1349 15     Temp 09/03/23 1349 99.5 F (37.5 C)     Temp Source 09/03/23 1349 Oral     SpO2 09/03/23 1349 96 %     Weight --      Height --      Head Circumference --      Peak Flow --      Pain Score 09/03/23 1348 5     Pain Loc --      Pain Education --      Exclude from Growth Chart --    No data found.  Updated Vital Signs BP 137/78 (BP Location: Right Arm)   Pulse 97   Temp 99.5 F (37.5 C) (Oral)   Resp 15   LMP 11/10/2016   SpO2 96%   Visual Acuity Right Eye Distance:   Left Eye Distance:   Bilateral Distance:    Right Eye Near:   Left  Eye Near:    Bilateral Near:     Physical Exam Vitals and nursing note reviewed.  Constitutional:      General: She is not in acute distress.    Appearance: She is well-developed.  HENT:     Head: Normocephalic and atraumatic.     Right Ear: Tympanic membrane normal.     Left Ear: Tympanic membrane normal.     Nose: Nose normal.     Mouth/Throat:     Mouth: Mucous membranes are moist.  Eyes:     Conjunctiva/sclera: Conjunctivae normal.  Cardiovascular:     Rate and Rhythm: Normal rate and regular rhythm.     Heart sounds: No murmur heard. Pulmonary:     Effort: Pulmonary effort is normal. No respiratory distress.     Breath sounds: Examination of the right-lower field reveals decreased breath sounds. Examination of the left-lower field reveals decreased breath sounds. Decreased breath sounds present. No wheezing, rhonchi or rales.  Abdominal:     Palpations: Abdomen is soft.     Tenderness: There is no abdominal tenderness.  Musculoskeletal:        General: No swelling.     Cervical back: Neck supple.  Skin:    General: Skin is warm and dry.     Capillary Refill: Capillary refill takes less than 2 seconds.  Neurological:     Mental Status: She is alert.  Psychiatric:        Mood and Affect: Mood normal.      UC Treatments / Results  Labs (all labs ordered are listed, but only abnormal results are displayed) Labs Reviewed - No data to display  EKG   Radiology No results found.  Procedures Procedures (including critical care time)  Medications Ordered in UC Medications - No data to display  Initial Impression / Assessment and Plan / UC Course  I have reviewed the triage vital signs and the nursing notes.  Pertinent labs & imaging results that were available during my care of the patient were reviewed by me and considered in my medical decision making (see chart for details).     Acute upper  respiratory infection   Acute upper respiratory infection that  has been going on for over a week now. Given the symptoms and length of time, we will treat with the following:  Azithromycin 250mg  Take 2 tablets today and the 1 tablet daily for 4 more days. Prednisone 40 mg daily for 5 days. Take this in the morning.  Rest and hydrate Return to urgent care or PCP if symptoms worsen or fail to resolve.    Final Clinical Impressions(s) / UC Diagnoses   Final diagnoses:  None   Discharge Instructions   None    ED Prescriptions   None    PDMP not reviewed this encounter.   Landis Martins, [place] 09/03/23 1417

## 2023-09-03 NOTE — ED Triage Notes (Signed)
Pt present swith c/o productive cough, yellow mucus and chest tightness. Pt states she has been wheezing.

## 2023-09-03 NOTE — Discharge Instructions (Addendum)
Acute upper respiratory infection that has been going on for over a week now. Given the symptoms and length of time, we will treat with the following:  Azithromycin 250mg  Take 2 tablets today and the 1 tablet daily for 4 more days. Prednisone 40 mg daily for 5 days. Take this in the morning.  Rest and hydrate Return to urgent care or PCP if symptoms worsen or fail to resolve.

## 2023-09-12 DIAGNOSIS — Z419 Encounter for procedure for purposes other than remedying health state, unspecified: Secondary | ICD-10-CM | POA: Diagnosis not present

## 2023-10-13 DIAGNOSIS — Z419 Encounter for procedure for purposes other than remedying health state, unspecified: Secondary | ICD-10-CM | POA: Diagnosis not present

## 2023-10-17 ENCOUNTER — Ambulatory Visit (INDEPENDENT_AMBULATORY_CARE_PROVIDER_SITE_OTHER): Payer: Medicaid Other | Admitting: Medical

## 2023-10-17 ENCOUNTER — Encounter: Payer: Self-pay | Admitting: Medical

## 2023-10-17 VITALS — BP 130/80 | HR 71 | Temp 97.9°F | Resp 16 | Ht 61.0 in | Wt 194.0 lb

## 2023-10-17 DIAGNOSIS — K219 Gastro-esophageal reflux disease without esophagitis: Secondary | ICD-10-CM | POA: Diagnosis not present

## 2023-10-17 DIAGNOSIS — R1013 Epigastric pain: Secondary | ICD-10-CM

## 2023-10-17 DIAGNOSIS — R748 Abnormal levels of other serum enzymes: Secondary | ICD-10-CM

## 2023-10-17 MED ORDER — FAMOTIDINE 20 MG PO TABS: 20.0000 mg | ORAL_TABLET | Freq: Every day | ORAL | 0 refills | Status: AC

## 2023-10-17 NOTE — Progress Notes (Signed)
 Subjective:    Patient ID: Sharon Blair, female    DOB: 28-Oct-1965, 58 y.o.   MRN: [id number]  HPI She has been experiencing upper abdominal pain for nearly two weeks, described as a contraction or knot-like sensation occurring after eating, regardless of the type of food consumed, including non-greasy foods like oatmeal with milk. Her stomach feels inflamed, and she experiences a sensation of pressure after eating, even with small amounts like an apple.  She reports symptoms of gastroesophageal reflux, which have worsened over the past month. These symptoms are particularly bothersome at night when lying down, causing difficulty sleeping due to a sensation of acid rising to her throat. She describes the sensation as 'a little acidic' and feels it 'stuck in the throat'. No bitter taste, fever, vomiting, or nausea, but she reports experiencing chills.  She has a history of H. pylori infection diagnosed years ago through a breath test. She has been using over-the-counter omeprazole 20 mg for acid relief, which initially helped but she stopped taking it and symptoms returned. She also uses gas relief tablets.  In the past, she underwent sonograms of the stomach ordered by a previous doctor, though the details and results of these studies are not specified.    Review of Systems  Constitutional:  Negative for chills, fatigue and fever.  Respiratory:  Negative for cough, chest tightness, shortness of breath and wheezing.   Cardiovascular:  Negative for chest pain and palpitations.  Gastrointestinal:  Positive for abdominal pain. Negative for constipation, diarrhea, nausea and vomiting.  Genitourinary:  Negative for dysuria.  Musculoskeletal:  Negative for back pain.  Skin:  Negative for rash.  Neurological:  Negative for dizziness and headaches.  Hematological:  Negative for adenopathy. Does not bruise/bleed easily.  Psychiatric/Behavioral:  Negative for behavioral problems and confusion.  The patient is not nervous/anxious.    Past Medical History:  Diagnosis Date   High cholesterol      Social History   Socioeconomic History   Marital status: Single    Spouse name: Not on file   Number of children: Not on file   Years of education: Not on file   Highest education level: Not on file  Occupational History   Not on file  Tobacco Use   Smoking status: Never   Smokeless tobacco: Never  Vaping Use   Vaping status: Never Used  Substance and Sexual Activity   Alcohol use: Yes    Alcohol/week: 2.0 standard drinks of alcohol    Types: 2 Standard drinks or equivalent per week    Comment: SOCIAL. glasses of wine a month.   Drug use: No   Sexual activity: Yes    Birth control/protection: Condom    Comment: .. 1st intercourse- 20, partners- 5  Other Topics Concern   Not on file  Social History Narrative   Not on file   Social Drivers of Health   Financial Resource Strain: Not on file  Food Insecurity: Not on file  Transportation Needs: Not on file  Physical Activity: Not on file  Stress: Not on file  Social Connections: Unknown (01/24/2022)   Received from [facility], [facility]   Social Network    Social Network: Not on file  Intimate Partner Violence: Unknown (12/16/2021)   Received from [facility], [facility]   HITS    Physically Hurt: Not on file    Insult or Talk Down To: Not on file    Threaten Physical Harm: Not on file  Scream or Curse: Not on file    No past surgical history on file.  Family History  Problem Relation Age of Onset   Hypertension Mother    Osteoporosis Mother    Diabetes Paternal Grandfather    Breast cancer Neg Hx     Allergies  Allergen Reactions   Fosfomycin Swelling   Monurol [Fosfomycin Tromethamine ] Swelling   Prednisone      Current Outpatient Medications on File Prior to Visit  Medication Sig Dispense Refill   azithromycin  (ZITHROMAX ) 250 MG tablet Take first 2 tablets together, then 1 every day  until finished. 6 tablet 0   Vitamin D , Ergocalciferol , (DRISDOL ) 1.25 MG (50000 UNIT) CAPS capsule Take 1 capsule (50,000 Units total) by mouth every 7 (seven) days. 8 capsule 0   No current facility-administered medications on file prior to visit.    BP 130/80   Pulse 71   Temp 97.9 F (36.6 C) (Oral)   Resp 16   Ht 5' 1 (1.549 m)   Wt 194 lb (88 kg)   LMP 11/10/2016   SpO2 98%   BMI 36.66 kg/m        Objective:   Physical Exam  General Mental Status- Alert. General Appearance- Not in acute distress.   Skin General: Color- Normal Color. Moisture- Normal Moisture.  Neck Carotid Arteries- Normal color. Moisture- Normal Moisture. No carotid bruits. No JVD.  Chest and Lung Exam Auscultation: Breath Sounds:-CTA  Cardiovascular Auscultation:Rythm- RRR Murmurs & Other Heart Sounds:Auscultation of the heart reveals- No Murmurs.  Abdomen Inspection:-Inspeection Normal. Palpation/Percussion:Note:No mass. Palpation and Percussion of the abdomen reveal- Mild epigastric Tenderness, Non Distended + BS, no rebound or guarding.  Back- no cva tenderness.  Neurologic Cranial Nerve exam:- CN III-XII intact(No nystagmus), symmetric smile. Strength:- 5/5 equal and symmetric strength both upper and lower extremities.       Assessment & Plan:   Patient Instructions  Gastritis and Gastroesophageal Reflux Disease (GERD) Upper abdominal pain for two weeks, worsened postprandially and when lying down. History of H. pylori infection. Currently taking over-the-counter omeprazole 20mg  with partial relief. -Increase omeprazole to 40mg  daily. -Reviewed foods/beverages to avoid -Add famotidine  20mg  daily. -Order blood tests for infection, liver enzymes, and white blood cell count. -Consider ordering an abdominal ultrasound based on blood test results. -since on omeprazole decided not to breath or stool antigen h pylori test. -may need to refer to GI MD -if abd pain worsens/changes  or severe be seen in the ED -Follow-up appointment in one week to assess symptom progression.  Potential Hepatic Steatosis History of elevated liver enzymes. -Check liver enzymes in blood tests. -Consider abdominal ultrasound based on results.

## 2023-10-17 NOTE — Patient Instructions (Addendum)
 Gastritis and Gastroesophageal Reflux Disease (GERD) Upper abdominal pain for two weeks, worsened postprandially and when lying down. History of H. pylori infection. Currently taking over-the-counter omeprazole 20mg  with partial relief. -Increase omeprazole to 40mg  daily. -Reviewed foods/beverages to avoid -Add famotidine  20mg  daily. -Order blood tests for infection, liver enzymes, and white blood cell count. -Consider ordering an abdominal ultrasound based on blood test results. -since on omeprazole decided not to breath or stool antigen h pylori test. -may need to refer to GI MD -if abd pain worsens/changes or severe be seen in the ED -Follow-up appointment in one week to assess symptom progression.  Potential Hepatic Steatosis History of elevated liver enzymes. -Check liver enzymes in blood tests. -Consider abdominal ultrasound based on results.

## 2023-10-18 ENCOUNTER — Encounter: Payer: Self-pay | Admitting: Medical

## 2023-10-18 LAB — COMPREHENSIVE METABOLIC PANEL
ALT: 76 U/L — ABNORMAL HIGH (ref 0–35)
AST: 72 U/L — ABNORMAL HIGH (ref 0–37)
Albumin: 4.2 g/dL (ref 3.5–5.2)
Alkaline Phosphatase: 86 U/L (ref 39–117)
BUN: 12 mg/dL (ref 6–23)
CO2: 25 meq/L (ref 19–32)
Calcium: 9.4 mg/dL (ref 8.4–10.5)
Chloride: 103 meq/L (ref 96–112)
Creatinine, Ser: 0.54 mg/dL (ref 0.40–1.20)
GFR: 103.57 mL/min (ref >60.00)
Glucose, Bld: 84 mg/dL (ref 70–99)
Potassium: 3.7 meq/L (ref 3.5–5.1)
Sodium: 138 meq/L (ref 135–145)
Total Bilirubin: 0.8 mg/dL (ref 0.2–1.2)
Total Protein: 7.7 g/dL (ref 6.0–8.3)

## 2023-10-18 LAB — CBC WITH DIFFERENTIAL/PLATELET
Basophils Absolute: 0 10*3/uL (ref 0.0–0.1)
Basophils Relative: 0.8 % (ref 0.0–3.0)
Eosinophils Absolute: 0.2 10*3/uL (ref 0.0–0.7)
Eosinophils Relative: 2.8 % (ref 0.0–5.0)
HCT: 41.6 % (ref 36.0–46.0)
Hemoglobin: 14.4 g/dL (ref 12.0–15.0)
Lymphocytes Relative: 36.6 % (ref 12.0–46.0)
Lymphs Abs: 2.1 10*3/uL (ref 0.7–4.0)
MCHC: 34.5 g/dL (ref 30.0–36.0)
MCV: 99.2 fL (ref 78.0–100.0)
Monocytes Absolute: 0.4 10*3/uL (ref 0.1–1.0)
Monocytes Relative: 7.2 % (ref 3.0–12.0)
Neutro Abs: 3.1 10*3/uL (ref 1.4–7.7)
Neutrophils Relative %: 52.6 % (ref 43.0–77.0)
Platelets: 207 10*3/uL (ref 150.0–400.0)
RBC: 4.2 Mil/uL (ref 3.87–5.11)
RDW: 12.9 % (ref 11.5–15.5)
WBC: 5.8 10*3/uL (ref 4.0–10.5)

## 2023-10-18 LAB — LIPASE: Lipase: 27 U/L (ref 11.0–59.0)

## 2023-10-18 NOTE — Addendum Note (Signed)
 Addended by: Serafina Damme on: 10/18/2023 07:39 PM   Modules accepted: Orders

## 2023-10-19 ENCOUNTER — Telehealth (HOSPITAL_BASED_OUTPATIENT_CLINIC_OR_DEPARTMENT_OTHER): Payer: Self-pay

## 2023-10-24 ENCOUNTER — Ambulatory Visit: Payer: Medicaid Other | Admitting: Medical

## 2023-10-26 ENCOUNTER — Ambulatory Visit: Payer: Medicaid Other | Admitting: Medical

## 2023-10-29 ENCOUNTER — Telehealth (HOSPITAL_BASED_OUTPATIENT_CLINIC_OR_DEPARTMENT_OTHER): Payer: Self-pay

## 2023-11-10 DIAGNOSIS — Z419 Encounter for procedure for purposes other than remedying health state, unspecified: Secondary | ICD-10-CM | POA: Diagnosis not present

## 2023-11-13 ENCOUNTER — Ambulatory Visit (HOSPITAL_BASED_OUTPATIENT_CLINIC_OR_DEPARTMENT_OTHER)
Admission: RE | Admit: 2023-11-13 | Discharge: 2023-11-13 | Disposition: A | Discharge status: Home / Self Care | Payer: Medicaid Other | Source: Ambulatory Visit | Attending: Medical | Admitting: Medical

## 2023-11-13 ENCOUNTER — Encounter: Payer: Self-pay | Admitting: Medical

## 2023-11-13 DIAGNOSIS — R1013 Epigastric pain: Secondary | ICD-10-CM | POA: Diagnosis not present

## 2023-11-13 DIAGNOSIS — R748 Abnormal levels of other serum enzymes: Secondary | ICD-10-CM | POA: Diagnosis not present

## 2023-11-13 DIAGNOSIS — R7989 Other specified abnormal findings of blood chemistry: Secondary | ICD-10-CM | POA: Diagnosis not present

## 2023-11-13 DIAGNOSIS — K760 Fatty (change of) liver, not elsewhere classified: Secondary | ICD-10-CM | POA: Diagnosis not present

## 2023-11-13 DIAGNOSIS — R109 Unspecified abdominal pain: Secondary | ICD-10-CM | POA: Diagnosis not present

## 2023-12-22 DIAGNOSIS — Z419 Encounter for procedure for purposes other than remedying health state, unspecified: Secondary | ICD-10-CM | POA: Diagnosis not present

## 2024-01-09 ENCOUNTER — Ambulatory Visit (INDEPENDENT_AMBULATORY_CARE_PROVIDER_SITE_OTHER): Payer: Medicaid Other | Admitting: Medical

## 2024-01-09 ENCOUNTER — Encounter: Payer: Self-pay | Admitting: Medical

## 2024-01-09 VITALS — BP 122/60 | HR 80 | Temp 98.0°F | Resp 18 | Ht 61.0 in | Wt 199.0 lb

## 2024-01-09 DIAGNOSIS — E559 Vitamin D deficiency, unspecified: Secondary | ICD-10-CM

## 2024-01-09 DIAGNOSIS — Z13220 Encounter for screening for lipoid disorders: Secondary | ICD-10-CM

## 2024-01-09 DIAGNOSIS — Z1231 Encounter for screening mammogram for malignant neoplasm of breast: Secondary | ICD-10-CM

## 2024-01-09 DIAGNOSIS — Z0000 Encounter for general adult medical examination without abnormal findings: Secondary | ICD-10-CM

## 2024-01-09 DIAGNOSIS — Z1211 Encounter for screening for malignant neoplasm of colon: Secondary | ICD-10-CM

## 2024-01-09 DIAGNOSIS — R739 Hyperglycemia, unspecified: Secondary | ICD-10-CM | POA: Diagnosis not present

## 2024-01-09 LAB — LIPID PANEL
Cholesterol: 287 mg/dL — ABNORMAL HIGH (ref 0–200)
HDL: 50.9 mg/dL (ref >39.00)
LDL Cholesterol: 192 mg/dL — ABNORMAL HIGH (ref 0–99)
NonHDL: 236.05
Total CHOL/HDL Ratio: 6
Triglycerides: 219 mg/dL — ABNORMAL HIGH (ref 0.0–149.0)
VLDL: 43.8 mg/dL — ABNORMAL HIGH (ref 0.0–40.0)

## 2024-01-09 LAB — CBC WITH DIFFERENTIAL/PLATELET
Basophils Absolute: 0 10*3/uL (ref 0.0–0.1)
Basophils Relative: 0.5 % (ref 0.0–3.0)
Eosinophils Absolute: 0.2 10*3/uL (ref 0.0–0.7)
Eosinophils Relative: 4.7 % (ref 0.0–5.0)
HCT: 42.2 % (ref 36.0–46.0)
Hemoglobin: 14.2 g/dL (ref 12.0–15.0)
Lymphocytes Relative: 33.5 % (ref 12.0–46.0)
Lymphs Abs: 1.5 10*3/uL (ref 0.7–4.0)
MCHC: 33.7 g/dL (ref 30.0–36.0)
MCV: 99.6 fl (ref 78.0–100.0)
Monocytes Absolute: 0.3 10*3/uL (ref 0.1–1.0)
Monocytes Relative: 7.5 % (ref 3.0–12.0)
Neutro Abs: 2.5 10*3/uL (ref 1.4–7.7)
Neutrophils Relative %: 53.8 % (ref 43.0–77.0)
Platelets: 196 10*3/uL (ref 150.0–400.0)
RBC: 4.23 Mil/uL (ref 3.87–5.11)
RDW: 13.3 % (ref 11.5–15.5)
WBC: 4.6 10*3/uL (ref 4.0–10.5)

## 2024-01-09 LAB — COMPREHENSIVE METABOLIC PANEL WITH GFR
ALT: 66 U/L — ABNORMAL HIGH (ref 0–35)
AST: 73 U/L — ABNORMAL HIGH (ref 0–37)
Albumin: 4.2 g/dL (ref 3.5–5.2)
Alkaline Phosphatase: 76 U/L (ref 39–117)
BUN: 11 mg/dL (ref 6–23)
CO2: 31 meq/L (ref 19–32)
Calcium: 9.3 mg/dL (ref 8.4–10.5)
Chloride: 102 meq/L (ref 96–112)
Creatinine, Ser: 0.53 mg/dL (ref 0.40–1.20)
GFR: 103.87 mL/min (ref >60.00)
Glucose, Bld: 93 mg/dL (ref 70–99)
Potassium: 3.9 meq/L (ref 3.5–5.1)
Sodium: 139 meq/L (ref 135–145)
Total Bilirubin: 0.9 mg/dL (ref 0.2–1.2)
Total Protein: 7.5 g/dL (ref 6.0–8.3)

## 2024-01-09 LAB — HEMOGLOBIN A1C: Hgb A1c MFr Bld: 5.6 % (ref 4.6–6.5)

## 2024-01-09 LAB — VITAMIN D 25 HYDROXY (VIT D DEFICIENCY, FRACTURES): VITD: 20.98 ng/mL — ABNORMAL LOW (ref 30.00–100.00)

## 2024-01-09 MED ORDER — ATORVASTATIN CALCIUM 10 MG PO TABS: 10.0000 mg | ORAL_TABLET | Freq: Every day | ORAL | 3 refills | Status: AC

## 2024-01-09 MED ORDER — VITAMIN D (ERGOCALCIFEROL) 1.25 MG (50000 UNIT) PO CAPS: 50000.0000 [IU] | ORAL_CAPSULE | ORAL | 0 refills | Status: AC

## 2024-01-09 NOTE — Patient Instructions (Addendum)
 For you wellness exam today I have ordered cbc, A1c, vit d, cmp and lipid panel.   Shingrix thru pharmacy.    Placed referral to gastroenterologist for screening colnooscopy([phone]) Mammogram order placed.   Recommend exercise and healthy diet.   We will let you know lab results as they come in.   Follow up date appointment will be determined after lab review.   Preventive Care 48-56 Years Old, Female Preventive care refers to lifestyle choices and visits with your health care provider that can promote health and wellness. Preventive care visits are also called wellness exams. What can I expect for my preventive care visit? Counseling Your health care provider may ask you questions about your: Medical history, including: Past medical problems. Family medical history. Pregnancy history. Current health, including: Menstrual cycle. Method of birth control. Emotional well-being. Home life and relationship well-being. Sexual activity and sexual health. Lifestyle, including: Alcohol, nicotine or tobacco, and drug use. Access to firearms. Diet, exercise, and sleep habits. Work and work Astronomer. Sunscreen use. Safety issues such as seatbelt and bike helmet use. Physical exam Your health care provider will check your: Height and weight. These may be used to calculate your BMI (body mass index). BMI is a measurement that tells if you are at a healthy weight. Waist circumference. This measures the distance around your waistline. This measurement also tells if you are at a healthy weight and may help predict your risk of certain diseases, such as type 2 diabetes and high blood pressure. Heart rate and blood pressure. Body temperature. Skin for abnormal spots. What immunizations do I need?  Vaccines are usually given at various ages, according to a schedule. Your health care provider will recommend vaccines for you based on your age, medical history, and lifestyle or other  factors, such as travel or where you work. What tests do I need? Screening Your health care provider may recommend screening tests for certain conditions. This may include: Lipid and cholesterol levels. Diabetes screening. This is done by checking your blood sugar (glucose) after you have not eaten for a while (fasting). Pelvic exam and Pap test. Hepatitis B test. Hepatitis C test. HIV (human immunodeficiency virus) test. STI (sexually transmitted infection) testing, if you are at risk. Lung cancer screening. Colorectal cancer screening. Mammogram. Talk with your health care provider about when you should start having regular mammograms. This may depend on whether you have a family history of breast cancer. BRCA-related cancer screening. This may be done if you have a family history of breast, ovarian, tubal, or peritoneal cancers. Bone density scan. This is done to screen for osteoporosis. Talk with your health care provider about your test results, treatment options, and if necessary, the need for more tests. Follow these instructions at home: Eating and drinking  Eat a diet that includes fresh fruits and vegetables, whole grains, lean protein, and low-fat dairy products. Take vitamin and mineral supplements as recommended by your health care provider. Do not drink alcohol if: Your health care provider tells you not to drink. You are pregnant, may be pregnant, or are planning to become pregnant. If you drink alcohol: Limit how much you have to 0-1 drink a day. Know how much alcohol is in your drink. In the [place] one drink equals one 12 oz bottle of beer (355 mL), one 5 oz glass of wine (148 mL), or one 1 oz glass of hard liquor (44 mL). Lifestyle Brush your teeth every morning and night with fluoride toothpaste. Floss one  time each day. Exercise for at least 30 minutes 5 or more days each week. Do not use any products that contain nicotine or tobacco. These products include  cigarettes, chewing tobacco, and vaping devices, such as e-cigarettes. If you need help quitting, ask your health care provider. Do not use drugs. If you are sexually active, practice safe sex. Use a condom or other form of protection to prevent STIs. If you do not wish to become pregnant, use a form of birth control. If you plan to become pregnant, see your health care provider for a prepregnancy visit. Take aspirin only as told by your health care provider. Make sure that you understand how much to take and what form to take. Work with your health care provider to find out whether it is safe and beneficial for you to take aspirin daily. Find healthy ways to manage stress, such as: Meditation, yoga, or listening to music. Journaling. Talking to a trusted person. Spending time with friends and family. Minimize exposure to UV radiation to reduce your risk of skin cancer. Safety Always wear your seat belt while driving or riding in a vehicle. Do not drive: If you have been drinking alcohol. Do not ride with someone who has been drinking. When you are tired or distracted. While texting. If you have been using any mind-altering substances or drugs. Wear a helmet and other protective equipment during sports activities. If you have firearms in your house, make sure you follow all gun safety procedures. Seek help if you have been physically or sexually abused. What's next? Visit your health care provider once a year for an annual wellness visit. Ask your health care provider how often you should have your eyes and teeth checked. Stay up to date on all vaccines. This information is not intended to replace advice given to you by your health care provider. Make sure you discuss any questions you have with your health care provider. Document Revised: 02/23/2021 Document Reviewed: 02/23/2021 Elsevier Patient Education  2024 [employer].

## 2024-01-09 NOTE — Progress Notes (Signed)
 Subjective:    Patient ID: Sharon Blair, female    DOB: 09/03/1966, 58 y.o.   MRN: [id number]  HPI  Works day care. Pt does not walk weekly due to pollen but plans to restart.Trying to eat health and lose weight. No smoker. Drinks alcohol maybe 2 glasses of wine a month/social. 4 children. Single      Pt never had colonoscopy. Placed referral last year. Office called and no response per review.   Gyn- referral placed for pap last year. Pt did not go.    Due for repeat mammogarm   Advised can get shingrix thru pharmacy last year and again advised.   Hx of vit D deficiency. Pt is on otc vit D  Last papsmear one year ago negative.  Review of Systems  Constitutional:  Negative for chills, fatigue and fever.  HENT:  Negative for congestion, ear discharge and ear pain.   Respiratory:  Negative for cough, chest tightness, shortness of breath and wheezing.   Cardiovascular:  Negative for chest pain and palpitations.  Gastrointestinal:  Negative for abdominal pain, blood in stool, nausea and vomiting.  Genitourinary:  Negative for dysuria.  Musculoskeletal:  Negative for back pain and joint swelling.  Skin:  Negative for rash.  Neurological:  Negative for dizziness, light-headedness, numbness and headaches.  Hematological:  Negative for adenopathy. Does not bruise/bleed easily.  Psychiatric/Behavioral:  Negative for behavioral problems and confusion.     Past Medical History:  Diagnosis Date   High cholesterol      Social History   Socioeconomic History   Marital status: Single    Spouse name: Not on file   Number of children: Not on file   Years of education: Not on file   Highest education level: Not on file  Occupational History   Not on file  Tobacco Use   Smoking status: Never   Smokeless tobacco: Never  Vaping Use   Vaping status: Never Used  Substance and Sexual Activity   Alcohol use: Yes    Alcohol/week: 2.0 standard drinks of alcohol    Types: 2  Standard drinks or equivalent per week    Comment: SOCIAL. glasses of wine a month.   Drug use: No   Sexual activity: Yes    Birth control/protection: Condom    Comment: .. 1st intercourse- 20, partners- 5  Other Topics Concern   Not on file  Social History Narrative   Not on file   Social Drivers of Health   Financial Resource Strain: Not on file  Food Insecurity: Not on file  Transportation Needs: Not on file  Physical Activity: Not on file  Stress: Not on file  Social Connections: Unknown (01/24/2022)   Received from [facility], [facility]   Social Network    Social Network: Not on file  Intimate Partner Violence: Unknown (12/16/2021)   Received from [facility], [facility]   HITS    Physically Hurt: Not on file    Insult or Talk Down To: Not on file    Threaten Physical Harm: Not on file    Scream or Curse: Not on file    No past surgical history on file.  Family History  Problem Relation Age of Onset   Hypertension Mother    Osteoporosis Mother    Diabetes Paternal Grandfather    Breast cancer Neg Hx     Allergies  Allergen Reactions   Fosfomycin Swelling   Monurol [Fosfomycin Tromethamine] Swelling   Prednisone   Current Outpatient Medications on File Prior to Visit  Medication Sig Dispense Refill   azithromycin  (ZITHROMAX ) 250 MG tablet Take first 2 tablets together, then 1 every day until finished. 6 tablet 0   famotidine  (PEPCID ) 20 MG tablet Take 1 tablet (20 mg total) by mouth daily. 30 tablet 0   Vitamin D , Ergocalciferol , (DRISDOL ) 1.25 MG (50000 UNIT) CAPS capsule Take 1 capsule (50,000 Units total) by mouth every 7 (seven) days. 8 capsule 0   No current facility-administered medications on file prior to visit.    BP 122/60   Pulse 80   Temp 98 F (36.7 C)   Resp 18   Ht 5\' 1"  (1.549 m)   Wt 199 lb (90.3 kg)   LMP 11/10/2016   SpO2 99%   BMI 37.60 kg/m        Objective:   Physical Exam   General Mental Status- Alert.  General Appearance- Not in acute distress.   Skin General: Color- Normal Color. Moisture- Normal Moisture.  Neck Carotid Arteries- Normal color. Moisture- Normal Moisture. No carotid bruits. No JVD.  Chest and Lung Exam Auscultation: Breath Sounds:-Normal.  Cardiovascular Auscultation:Rythm- Regular. Murmurs & Other Heart Sounds:Auscultation of the heart reveals- No Murmurs.  Abdomen Inspection:-Inspeection Normal. Palpation/Percussion:Note:No mass. Palpation and Percussion of the abdomen reveal- Non Tender, Non Distended + BS, no rebound or guarding.    Neurologic Cranial Nerve exam:- CN III-XII intact(No nystagmus), symmetric smile.  Strength:- 5/5 equal and symmetric strength both upper and lower extremities.      Assessment & Plan:   Patient Instructions  For you wellness exam today I have ordered cbc, A1c, vit d, cmp and lipid panel.   Shingrix thru pharmacy.    Placed referral to gastroenterologist for screening colnooscopy. Mammogram order placed.   Recommend exercise and healthy diet.   We will let you know lab results as they come in.   Follow up date appointment will be determined after lab review.     Rhianna Raulerson, PA-C

## 2024-01-09 NOTE — Addendum Note (Signed)
 Addended by: Serafina Damme on: 01/09/2024 09:08 PM   Modules accepted: Orders

## 2024-01-10 NOTE — Progress Notes (Signed)
 Patient notified of results, new medications, provider's comments and scheduled to return in about 9 weeks for ov and vitamin D  check

## 2024-01-15 ENCOUNTER — Telehealth (HOSPITAL_BASED_OUTPATIENT_CLINIC_OR_DEPARTMENT_OTHER): Payer: Self-pay

## 2024-01-21 DIAGNOSIS — Z419 Encounter for procedure for purposes other than remedying health state, unspecified: Secondary | ICD-10-CM | POA: Diagnosis not present

## 2024-02-12 ENCOUNTER — Encounter: Payer: Self-pay | Admitting: Medical

## 2024-02-18 DIAGNOSIS — K036 Deposits [accretions] on teeth: Secondary | ICD-10-CM | POA: Diagnosis not present

## 2024-02-20 ENCOUNTER — Telehealth (HOSPITAL_BASED_OUTPATIENT_CLINIC_OR_DEPARTMENT_OTHER): Payer: Self-pay

## 2024-02-21 ENCOUNTER — Inpatient Hospital Stay (HOSPITAL_BASED_OUTPATIENT_CLINIC_OR_DEPARTMENT_OTHER): Admission: RE | Admit: 2024-02-21 | Source: Ambulatory Visit

## 2024-02-21 DIAGNOSIS — Z419 Encounter for procedure for purposes other than remedying health state, unspecified: Secondary | ICD-10-CM | POA: Diagnosis not present

## 2024-03-08 ENCOUNTER — Other Ambulatory Visit: Payer: Self-pay

## 2024-03-08 ENCOUNTER — Ambulatory Visit
Admission: RE | Admit: 2024-03-08 | Discharge: 2024-03-08 | Disposition: A | Discharge status: Home / Self Care | Source: Ambulatory Visit | Attending: Physician Assistant | Admitting: Physician Assistant

## 2024-03-08 VITALS — BP 129/76 | HR 80 | Temp 98.1°F | Resp 18 | Ht <= 58 in | Wt 195.0 lb

## 2024-03-08 DIAGNOSIS — R21 Rash and other nonspecific skin eruption: Secondary | ICD-10-CM | POA: Diagnosis not present

## 2024-03-08 MED ORDER — ONDANSETRON 4 MG PO TBDP: 4.0000 mg | ORAL_TABLET | Freq: Three times a day (TID) | ORAL | 0 refills | Status: AC | PRN

## 2024-03-08 MED ORDER — METHYLPREDNISOLONE 4 MG PO TBPK: ORAL_TABLET | ORAL | 0 refills | Status: DC

## 2024-03-08 MED ORDER — TRIAMCINOLONE ACETONIDE 0.1 % EX CREA: 1.0000 | TOPICAL_CREAM | Freq: Two times a day (BID) | CUTANEOUS | 0 refills | Status: AC

## 2024-03-08 NOTE — ED Triage Notes (Addendum)
 Pt presents with a chief complaint of rash on neck, chest, and bilateral arms. States the rash began yesterday morning 6/27 at 0930. Currently rates her overall pain a 6/10. States her skin is itching very badly and burning. Pt is unsure of what triggered the rash. Works at a daycare.

## 2024-03-08 NOTE — Discharge Instructions (Addendum)
 You were seen today for concerns of a skin rash.  At this time I suspect that your rash is caused by some kind of contact with an irritant or an allergen that your body is reacting to.  I do not think that your rash is contagious. To help with managing your symptoms I am sending in a steroid called a Medrol Dosepak.  Please take this according to the directions on the package.  Since you have prednisone  listed as an allergy that causes nausea and vomiting I will also send in a medication called Zofran should you develop any nausea or vomiting with the steroid.  I have also sent in a medication called Zofran to assist with your nausea and vomiting.  You can take this as needed up to every 8 hours.  Please be advised that the most common side effect of this medication is constipation.  If you start to develop the symptoms I recommend taking a few doses of a stool softener and increasing your fiber.  If needed you can also take a few doses of MiraLAX until you have regular bowel movements again. While you are waiting for the steroid to take effect I have also sent in a steroid cream for you to apply to the affected areas.  You can apply this up to twice per day.  This should help with the itching and burning. If you develop any of the following please return here or go to the emergency room: Blisters, fever or chills, the rash starts to spread becomes worse, skin ulcerations.

## 2024-03-08 NOTE — ED Provider Notes (Signed)
 [facility]    CSN: [id number] Arrival date & time: 03/08/24  1148      History   Chief Complaint Chief Complaint  Patient presents with   Rash    Rash itching all over my neck arm - Entered by patient    HPI Sharon Blair is a 58 y.o. female.   HPI  RASH Duration:  days =- started yesterday  Location: trunk and arms, neck  Itching: yes Burning: yes Redness: yes Oozing: no Scaling: no Blisters: no Painful: yes Fevers: no Change in detergents/soaps/personal care products: no- no changes to medications or perfumes either  Recent illness: no Recent travel:no History of same: no Context: She denies similar symptoms in other close contacts  Alleviating factors: benadryl topical seemed to help a little bit but limited relief  Treatments attempted:Benadryl cream/gel, Benadryl tablet  Shortness of breath: no  Throat/tongue swelling: no Myalgias/arthralgias: no   Past Medical History:  Diagnosis Date   High cholesterol     Patient Active Problem List   Diagnosis Date Noted   Mixed hyperlipidemia 12/11/2016   Headache 06/18/2013   H/O vitamin D  deficiency 11/08/2012   Weight gain 11/08/2012   Hair loss 11/08/2012    History reviewed. No pertinent surgical history.  OB History     Gravida  6   Para  4   Term  4   Preterm      AB  1   Living  4      SAB  1   IAB      Ectopic      Multiple      Live Births  4            Home Medications    Prior to Admission medications   Medication Sig Start Date End Date Taking? Authorizing Provider  methylPREDNISolone (MEDROL DOSEPAK) 4 MG TBPK tablet Take as directed on the box 03/08/24  Yes Murphy Bundick E, PA-C  ondansetron (ZOFRAN-ODT) 4 MG disintegrating tablet Take 1 tablet (4 mg total) by mouth every 8 (eight) hours as needed for nausea or vomiting. 03/08/24  Yes Jiyah Torpey E, PA-C  triamcinolone cream (KENALOG) 0.1 % Apply 1 Application topically 2 (two) times daily.  03/08/24  Yes Yarel Kilcrease E, PA-C  atorvastatin  (LIPITOR) 10 MG tablet Take 1 tablet (10 mg total) by mouth daily. 01/09/24   Saguier, [place], PA-C  azithromycin  (ZITHROMAX ) 250 MG tablet Take first 2 tablets together, then 1 every day until finished. 09/03/23   White, Elizabeth A, PA-C  famotidine  (PEPCID ) 20 MG tablet Take 1 tablet (20 mg total) by mouth daily. 10/17/23   Saguier, [place], PA-C  Vitamin D , Ergocalciferol , (DRISDOL ) 1.25 MG (50000 UNIT) CAPS capsule Take 1 capsule (50,000 Units total) by mouth every 7 (seven) days. 11/08/22   Saguier, [place], PA-C  Vitamin D , Ergocalciferol , (DRISDOL ) 1.25 MG (50000 UNIT) CAPS capsule Take 1 capsule (50,000 Units total) by mouth every 7 (seven) days. 01/09/24   Saguier, [place], PA-C    Family History Family History  Problem Relation Age of Onset   Hypertension Mother    Osteoporosis Mother    Diabetes Paternal Grandfather    Breast cancer Neg Hx     Social History Social History   Tobacco Use   Smoking status: Never   Smokeless tobacco: Never  Vaping Use   Vaping status: Never Used  Substance Use Topics   Alcohol use: Yes    Alcohol/week: 2.0 standard drinks of alcohol  Types: 2 Standard drinks or equivalent per week    Comment: SOCIAL. glasses of wine a month.   Drug use: No     Allergies   Fosfomycin, Monurol [fosfomycin tromethamine], and Prednisone    Review of Systems Review of Systems  Skin:  Positive for rash.     Physical Exam Triage Vital Signs ED Triage Vitals  Encounter Vitals Group     BP 03/08/24 1251 129/76     Girls Systolic BP Percentile --      Girls Diastolic BP Percentile --      Boys Systolic BP Percentile --      Boys Diastolic BP Percentile --      Pulse Rate 03/08/24 1251 80     Resp 03/08/24 1251 18     Temp 03/08/24 1251 98.1 F (36.7 C)     Temp Source 03/08/24 1251 Oral     SpO2 03/08/24 1251 98 %     Weight 03/08/24 1256 195 lb (88.5 kg)     Height 03/08/24 1256 4' 9 (1.448 m)      Head Circumference --      Peak Flow --      Pain Score 03/08/24 1253 6     Pain Loc --      Pain Education --      Exclude from Growth Chart --    No data found.  Updated Vital Signs BP 129/76 (BP Location: Right Arm)   Pulse 80   Temp 98.1 F (36.7 C) (Oral)   Resp 18   Ht 4' 9 (1.448 m)   Wt 195 lb (88.5 kg)   LMP 11/10/2016   SpO2 98%   BMI 42.20 kg/m   Visual Acuity Right Eye Distance:   Left Eye Distance:   Bilateral Distance:    Right Eye Near:   Left Eye Near:    Bilateral Near:     Physical Exam Vitals reviewed.  Constitutional:      General: She is awake.     Appearance: Normal appearance. She is well-developed and well-groomed.  HENT:     Head: Normocephalic and atraumatic.   Eyes:     General: Lids are normal. Gaze aligned appropriately.     Extraocular Movements: Extraocular movements intact.     Conjunctiva/sclera: Conjunctivae normal.   Pulmonary:     Effort: Pulmonary effort is normal.   Skin:    General: Skin is warm and dry.     Findings: Rash present. Rash is macular and papular. Rash is not crusting, nodular, purpuric, pustular, scaling, urticarial or vesicular.     Comments: Pt has blanching erythematous maculopapular rash along her right neck and shoulder and upper extremities  It is not present on her abdomen, back, face or lower extremities    Neurological:     Mental Status: She is alert and oriented to person, place, and time.   Psychiatric:        Attention and Perception: Attention and perception normal.        Mood and Affect: Mood and affect normal.        Speech: Speech normal.        Behavior: Behavior normal. Behavior is cooperative.      UC Treatments / Results  Labs (all labs ordered are listed, but only abnormal results are displayed) Labs Reviewed - No data to display  EKG   Radiology No results found.  Procedures Procedures (including critical care time)  Medications Ordered in UC Medications -  No  data to display  Initial Impression / Assessment and Plan / UC Course  I have reviewed the triage vital signs and the nursing notes.  Pertinent labs & imaging results that were available during my care of the patient were reviewed by me and considered in my medical decision making (see chart for details).      Final Clinical Impressions(s) / UC Diagnoses   Final diagnoses:  Rash and nonspecific skin eruption  Patient presents today with concerns for a rash along the right side of her neck and anterior chest.  She also reports involvement of the upper extremities.  There does not appear to be involvement of the face, palms lower extremities or feet.  There is limited involvement of the chest and back.  Physical exam is notable for maculopapular erythematous rash that blanches.  Patient denies changes to topical substances such as soaps, lotions, perfumes, detergents and does not recall being bitten by any insects although she was outside recently with children at her work.  Physical exam appears consistent with likely contact dermatitis.  Will send patient home with Medrol Dosepak and triamcinolone cream.  Patient is amenable to trying Medrol Dosepak to assist with resolution despite prednisone  being on allergy list.  She reports prednisone  does cause upset stomach and nausea so we will send patient home with Zofran to assist with symptoms should they arise.  ED and return precautions reviewed and provided after visit summary.  Follow-up as needed    Discharge Instructions      You were seen today for concerns of a skin rash.  At this time I suspect that your rash is caused by some kind of contact with an irritant or an allergen that your body is reacting to.  I do not think that your rash is contagious. To help with managing your symptoms I am sending in a steroid called a Medrol Dosepak.  Please take this according to the directions on the package.  Since you have prednisone  listed as an  allergy that causes nausea and vomiting I will also send in a medication called Zofran should you develop any nausea or vomiting with the steroid.  I have also sent in a medication called Zofran to assist with your nausea and vomiting.  You can take this as needed up to every 8 hours.  Please be advised that the most common side effect of this medication is constipation.  If you start to develop the symptoms I recommend taking a few doses of a stool softener and increasing your fiber.  If needed you can also take a few doses of MiraLAX until you have regular bowel movements again. While you are waiting for the steroid to take effect I have also sent in a steroid cream for you to apply to the affected areas.  You can apply this up to twice per day.  This should help with the itching and burning. If you develop any of the following please return here or go to the emergency room: Blisters, fever or chills, the rash starts to spread becomes worse, skin ulcerations.      ED Prescriptions     Medication Sig Dispense Auth. Provider   methylPREDNISolone (MEDROL DOSEPAK) 4 MG TBPK tablet Take as directed on the box 21 each Aroldo Galli E, PA-C   triamcinolone cream (KENALOG) 0.1 % Apply 1 Application topically 2 (two) times daily. 30 g Paris Hohn E, PA-C   ondansetron (ZOFRAN-ODT) 4 MG disintegrating tablet Take 1 tablet (  4 mg total) by mouth every 8 (eight) hours as needed for nausea or vomiting. 20 tablet Niya Behler E, PA-C      PDMP not reviewed this encounter.   Marylene Rocky BRAVO, PA-C 03/08/24 1707

## 2024-03-12 ENCOUNTER — Ambulatory Visit: Admitting: Medical

## 2024-03-22 DIAGNOSIS — Z419 Encounter for procedure for purposes other than remedying health state, unspecified: Secondary | ICD-10-CM | POA: Diagnosis not present

## 2024-03-24 ENCOUNTER — Ambulatory Visit (INDEPENDENT_AMBULATORY_CARE_PROVIDER_SITE_OTHER): Admitting: Physician Assistant

## 2024-03-24 ENCOUNTER — Ambulatory Visit: Payer: Self-pay

## 2024-03-24 ENCOUNTER — Encounter: Payer: Self-pay | Admitting: Physician Assistant

## 2024-03-24 VITALS — BP 127/72 | HR 81 | Ht <= 58 in | Wt 198.6 lb

## 2024-03-24 DIAGNOSIS — H8112 Benign paroxysmal vertigo, left ear: Secondary | ICD-10-CM

## 2024-03-24 DIAGNOSIS — H538 Other visual disturbances: Secondary | ICD-10-CM

## 2024-03-24 DIAGNOSIS — M542 Cervicalgia: Secondary | ICD-10-CM

## 2024-03-24 LAB — GLUCOSE, POCT (MANUAL RESULT ENTRY): POC Glucose: 100 mg/dL — AB (ref 70–99)

## 2024-03-24 MED ORDER — CELECOXIB 100 MG PO CAPS: 100.0000 mg | ORAL_CAPSULE | Freq: Two times a day (BID) | ORAL | 0 refills | Status: DC

## 2024-03-24 MED ORDER — MECLIZINE HCL 12.5 MG PO TABS: 12.5000 mg | ORAL_TABLET | Freq: Three times a day (TID) | ORAL | 0 refills | Status: DC | PRN

## 2024-03-24 NOTE — Progress Notes (Signed)
 Established patient visit   Patient: Sharon Blair   DOB: 09/09/1966   58 y.o. Female  MRN: [id number] Visit Date: 03/24/2024  Today's healthcare provider: Manuelita Flatness, PA-C   Chief Complaint  Patient presents with   Dizziness    Started early Friday morning. No nausea   Subjective     Discussed the use of AI scribe software for clinical note transcription with the patient, who gave verbal consent to proceed.  History of Present Illness   The patient presents with dizziness, vision changes, and neck pain.  Dizziness has been present for 4 days, occurring the most in the morning upon waking and at night when lying down. It is a sensation of movement when turning the head to the left or right. She feels the world is spinning, needs to close her eyes.  Denies recent head injury, headache, nausea, vomiting.  She reports blurry vision even when she is not acutely dizzy, present for the same time.    There are no recent injuries, nausea, vomiting, or fever.   She does have left sided neck pain that has been present x 2 weeks, unchanged, despite ibuprofen use.  Medications: Outpatient Medications Prior to Visit  Medication Sig   atorvastatin  (LIPITOR) 10 MG tablet Take 1 tablet (10 mg total) by mouth daily.   azithromycin  (ZITHROMAX ) 250 MG tablet Take first 2 tablets together, then 1 every day until finished.   famotidine  (PEPCID ) 20 MG tablet Take 1 tablet (20 mg total) by mouth daily.   methylPREDNISolone  (MEDROL  DOSEPAK) 4 MG TBPK tablet Take as directed on the box   ondansetron  (ZOFRAN -ODT) 4 MG disintegrating tablet Take 1 tablet (4 mg total) by mouth every 8 (eight) hours as needed for nausea or vomiting.   triamcinolone  cream (KENALOG ) 0.1 % Apply 1 Application topically 2 (two) times daily.   Vitamin D , Ergocalciferol , (DRISDOL ) 1.25 MG (50000 UNIT) CAPS capsule Take 1 capsule (50,000 Units total) by mouth every 7 (seven) days.   Vitamin D , Ergocalciferol ,  (DRISDOL ) 1.25 MG (50000 UNIT) CAPS capsule Take 1 capsule (50,000 Units total) by mouth every 7 (seven) days.   No facility-administered medications prior to visit.    Review of Systems  Constitutional:  Negative for fatigue and fever.  Respiratory:  Negative for cough and shortness of breath.   Cardiovascular:  Negative for chest pain and leg swelling.  Gastrointestinal:  Negative for abdominal pain.  Musculoskeletal:  Positive for neck pain.  Neurological:  Positive for dizziness. Negative for headaches.       Objective    BP 127/72   Pulse 81   Ht 4' 9 (1.448 m)   Wt 198 lb 9.6 oz (90.1 kg)   LMP 11/10/2016   BMI 42.98 kg/m    Physical Exam Vitals reviewed.  Constitutional:      Appearance: She is not ill-appearing.  HENT:     Head: Normocephalic.  Eyes:     Extraocular Movements: Extraocular movements intact.     Conjunctiva/sclera: Conjunctivae normal.     Pupils: Pupils are equal, round, and reactive to light.  Cardiovascular:     Rate and Rhythm: Normal rate.  Pulmonary:     Effort: Pulmonary effort is normal. No respiratory distress.  Musculoskeletal:     Comments: Left side of neck is tender to touch, tense. No massess appreciated, rashes  Neurological:     Mental Status: She is alert and oriented to person, place, and time.  Comments: Dix hallpike eval positive on the left side  Psychiatric:        Mood and Affect: Mood normal.        Behavior: Behavior normal.      Results for orders placed or performed in visit on 03/24/24  POCT Glucose (CBG)  Result Value Ref Range   POC Glucose 100 (A) 70 - 99 mg/dl    Assessment & Plan    Neck pain Rx celebrex  bid instead of ibuprofen.  Recommend stretches, warm compresses -     Celecoxib ; Take 1 capsule (100 mg total) by mouth 2 (two) times daily.  Dispense: 20 capsule; Refill: 0  Benign paroxysmal positional vertigo of left ear Explained pathophysiology and demonstrated maneuvers to reposition  otoliths. Discussed variable duration and potential need for further intervention if symptoms persist.  Rx meclizine . If no improvement in the next few days, would refer to vestib PT  -     Meclizine  HCl; Take 1 tablet (12.5 mg total) by mouth 3 (three) times daily as needed for dizziness.  Dispense: 30 tablet; Refill: 0  Blurred vision Likely related to vertigo.  Poc glucose 100 -     POCT glucose (manual entry)  Return if symptoms worsen or fail to improve.       Manuelita Flatness, PA-C  [facility] [phone] (phone) [phone] (fax)  [facility]

## 2024-03-24 NOTE — Telephone Encounter (Signed)
 FYI Only or Action Required?: FYI only for provider.  Patient was last seen in primary care on 01/09/2024 by Dorina Loving, PA-C.  Called Nurse Triage reporting Dizziness.  Symptoms began 3 days ago.  Interventions attempted: Rest, hydration, or home remedies.  Symptoms are: unchanged.  Triage Disposition: See Physician Within 24 Hours-appointment scheduled for today at patient's request  Patient/caregiver understands and will follow disposition?: Yes  Copied from CRM [id number]. Topic: Clinical - Red Word Triage >> Mar 24, 2024  8:50 AM Sharon Blair wrote: Red Word that prompted transfer to Nurse Triage: Patient states she has been experiencing dizziness since Friday when she lays down or sits up and is having pain from her shoulder to her neck on the left side. Also states her vision is a little blurry when she walks. Reason for Disposition  [1] MODERATE dizziness (e.g., vertigo; feels very unsteady, interferes with normal activities) AND [2] has NOT been evaluated by doctor (or NP/PA) for this  Answer Assessment - Initial Assessment Questions 1. DESCRIPTION: Describe your dizziness.     Patient reports that she develops dizziness when she lays down or sits up. Patient reports that if she is laying down and moves from side to side that she has similar symptoms 2. VERTIGO: Do you feel like either you or the room is spinning or tilting?      yes 3. LIGHTHEADED: Do you feel lightheaded? (e.g., somewhat faint, woozy, weak upon standing)     no 4. SEVERITY: How bad is it?  Can you walk?     Moderate 5. ONSET:  When did the dizziness begin?     Started Friday 6. AGGRAVATING FACTORS: Does anything make it worse? (e.g., standing, change in head position)     Change in head position 7. CAUSE: What do you think is causing the dizziness?     unsure 8. RECURRENT SYMPTOM: Have you had dizziness before? If Yes, ask: When was the last time? What happened that time?      no 9. OTHER SYMPTOMS: Do you have any other symptoms? (e.g., earache, headache, numbness, tinnitus, vomiting, weakness)     Vision is blurry at times, earache to right ear, generalized weakness.  Protocols used: Dizziness - Vertigo-A-AH

## 2024-03-26 ENCOUNTER — Other Ambulatory Visit: Payer: Self-pay | Admitting: Physician Assistant

## 2024-03-26 DIAGNOSIS — H8112 Benign paroxysmal vertigo, left ear: Secondary | ICD-10-CM

## 2024-03-26 NOTE — Telephone Encounter (Unsigned)
 Copied from CRM [id number]. Topic: General - Other >> Mar 26, 2024  8:55 AM Burnard DEL wrote: Reason for CRM: Patient was seen in office with Morna Flatness for vertigo. She was advised that if she wasn't better by Wednesday to call back and she would refer her to physical therapy to help with her vertigo

## 2024-04-22 DIAGNOSIS — Z419 Encounter for procedure for purposes other than remedying health state, unspecified: Secondary | ICD-10-CM | POA: Diagnosis not present

## 2024-05-23 DIAGNOSIS — Z419 Encounter for procedure for purposes other than remedying health state, unspecified: Secondary | ICD-10-CM | POA: Diagnosis not present

## 2024-06-16 ENCOUNTER — Emergency Department (HOSPITAL_BASED_OUTPATIENT_CLINIC_OR_DEPARTMENT_OTHER)
Admission: EM | Admit: 2024-06-16 | Discharge: 2024-06-16 | Disposition: A | Discharge status: Home / Self Care | Attending: Emergency Medicine | Admitting: Emergency Medicine

## 2024-06-16 ENCOUNTER — Encounter (HOSPITAL_BASED_OUTPATIENT_CLINIC_OR_DEPARTMENT_OTHER): Payer: Self-pay

## 2024-06-16 ENCOUNTER — Emergency Department (HOSPITAL_BASED_OUTPATIENT_CLINIC_OR_DEPARTMENT_OTHER)

## 2024-06-16 ENCOUNTER — Other Ambulatory Visit: Payer: Self-pay

## 2024-06-16 DIAGNOSIS — M25551 Pain in right hip: Secondary | ICD-10-CM | POA: Insufficient documentation

## 2024-06-16 DIAGNOSIS — M79604 Pain in right leg: Secondary | ICD-10-CM | POA: Diagnosis not present

## 2024-06-16 DIAGNOSIS — M25561 Pain in right knee: Secondary | ICD-10-CM | POA: Diagnosis present

## 2024-06-16 MED ORDER — KETOROLAC TROMETHAMINE 30 MG/ML IJ SOLN
30.0000 mg | Freq: Once | INTRAMUSCULAR | Status: AC
  Administered 2024-06-16: 30 mg via INTRAMUSCULAR
  Filled 2024-06-16: qty 1

## 2024-06-16 MED ORDER — DICLOFENAC SODIUM 1 % EX GEL: 2.0000 g | Freq: Four times a day (QID) | CUTANEOUS | 0 refills | Status: AC | PRN

## 2024-06-16 MED ORDER — MELOXICAM 15 MG PO TABS: 15.0000 mg | ORAL_TABLET | Freq: Every day | ORAL | 0 refills | Status: AC

## 2024-06-16 NOTE — Discharge Instructions (Signed)

## 2024-06-16 NOTE — ED Triage Notes (Signed)
 C/o right hip pain radiating to right knee. Pain worse with ambulation, some tingling. Denies injury. Ambulatory to room.

## 2024-06-16 NOTE — ED Provider Notes (Signed)
 Emergency Department Provider Note   I have reviewed the triage vital signs and the nursing notes.   HISTORY  Chief Complaint Leg Pain   HPI Sharon Blair is a 58 y.o. female past history of HLD presents the emergency department with right hip and knee pain.  Symptoms worsening over the past 4 days.  No known injury.  Some mild discomfort in the lower back but mainly patient having right lateral hip pain radiating anteriorly toward the knee.  No swelling.  No ankle or foot pain.  No low back pain radiating posteriorly. Able to ambulate but with a limp.   Past Medical History:  Diagnosis Date   High cholesterol     Review of Systems  Constitutional: No fever/chills Cardiovascular: Denies chest pain. Respiratory: Denies shortness of breath. Gastrointestinal: No abdominal pain.  No nausea, no vomiting.  Musculoskeletal: Positive right hip pain.  Skin: Negative for rash. Neurological: Negative for headaches, focal weakness or numbness.  ____________________________________________   PHYSICAL EXAM:  VITAL SIGNS: ED Triage Vitals  Encounter Vitals Group     BP 06/16/24 0925 (!) 157/79     Pulse Rate 06/16/24 0925 88     Resp 06/16/24 0925 18     Temp 06/16/24 0925 97.6 F (36.4 C)     Temp Source 06/16/24 0925 Oral     SpO2 06/16/24 0925 98 %     Weight 06/16/24 0923 186 lb (84.4 kg)     Height 06/16/24 0923 4' 9 (1.448 m)   Constitutional: Alert and oriented. Well appearing and in no acute distress. Eyes: Conjunctivae are normal.  Head: Atraumatic. Nose: No congestion/rhinnorhea. Mouth/Throat: Mucous membranes are moist. Neck: No stridor.   Cardiovascular: Good peripheral circulation.  Respiratory: Normal respiratory effort.   Gastrointestinal: Soft and nontender. No distention.  Musculoskeletal: Pain with passive range of motion of the right hip but full ROM performed.  No right knee effusion.  No anterior/posterior drawer.  Compartments are  soft. Neurologic:  Normal speech and language. No gross focal neurologic deficits are appreciated.  Skin:  Skin is warm, dry and intact. No rash noted.  ____________________________________________  RADIOLOGY  DG Knee Complete 4 Views Right Result Date: 06/16/2024 EXAM: 4 VIEW(S) XRAY OF THE RIGHT KNEE 06/16/2024 10:04:00 AM COMPARISON: None available. CLINICAL HISTORY: leg pain. Rt lat hip pain radiating down to rt ant knee since Friday. FINDINGS: BONES AND JOINTS: No acute fracture. No focal osseous lesion. No joint dislocation. No significant joint effusion. No significant degenerative changes. SOFT TISSUES: The soft tissues are unremarkable. IMPRESSION: 1. No significant abnormality. Electronically signed by: Norleen Boxer MD 06/16/2024 10:30 AM EDT RP Workstation: [id number]   DG Hip Unilat W or Wo Pelvis 2-3 Views Right Result Date: 06/16/2024 EXAM: 2 or 3 VIEW(S) XRAY OF THE RIGHT HIP 06/16/2024 10:04:00 AM COMPARISON: None available. CLINICAL HISTORY: Leg pain. Rt lat hip pain radiating down to rt ant knee since Friday. FINDINGS: BONES AND JOINTS: No acute fracture or focal osseous lesion. The hip joint is maintained. No significant degenerative changes. SOFT TISSUES: The soft tissues are unremarkable. IMPRESSION: 1. No significant abnormality in the right hip or visualized pelvis. Electronically signed by: Norleen Boxer MD 06/16/2024 10:30 AM EDT RP Workstation: [id number]    ____________________________________________   PROCEDURES  Procedure(s) performed:   Procedures  None ____________________________________________   INITIAL IMPRESSION / ASSESSMENT AND PLAN / ED COURSE  Pertinent labs & imaging results that were available during my care of the patient were reviewed by  me and considered in my medical decision making (see chart for details).   This patient is Presenting for Evaluation of hip pain, which does require a range of treatment options, and is a complaint that  involves a moderate risk of morbidity and mortality.  The Differential Diagnoses include hip strain, muscle strain, contusion, labral tear, fracture, dislocation, sciatica, etc.  Critical Interventions-    Medications  ketorolac (TORADOL) 30 MG/ML injection 30 mg (30 mg Intramuscular Given 06/16/24 0947)    Reassessment after intervention: pain improved.   Radiologic Tests Ordered, included hip pain. I independently interpreted the images and agree with radiology interpretation.   Medical Decision Making: Summary:  The patient presents emergency department with right hip pain rating to the knee.  Describes pain as more anterior.  Lower suspicion for sciatica.  No red flag signs or symptoms to prompt further spine imaging.  Plan for x-ray of the right hip and knee with Toradol here for pain.   Reevaluation with update and discussion with patient. XR reassuring. Discussed pain mgmt at home with plan for ortho follow up as an outpatient.   Patient's presentation is most consistent with acute, uncomplicated illness.   Disposition: discharge  ____________________________________________  FINAL CLINICAL IMPRESSION(S) / ED DIAGNOSES  Final diagnoses:  Right leg pain     NEW OUTPATIENT MEDICATIONS STARTED DURING THIS VISIT:  Discharge Medication List as of 06/16/2024 10:44 AM     START taking these medications   Details  diclofenac Sodium (VOLTAREN) 1 % GEL Apply 2 g topically 4 (four) times daily as needed., Starting Mon 06/16/2024, Normal    meloxicam (MOBIC) 15 MG tablet Take 1 tablet (15 mg total) by mouth daily for 7 days., Starting Mon 06/16/2024, Until Mon 06/23/2024, Normal        Note:  This document was prepared using Dragon voice recognition software and may include unintentional dictation errors.  Fonda Law, MD, [facility]    Christoher Drudge, Fonda MATSU, MD 06/16/24 [phone]

## 2024-06-17 ENCOUNTER — Ambulatory Visit: Admitting: Medical

## 2024-06-23 ENCOUNTER — Ambulatory Visit: Admitting: Medical

## 2024-08-22 DIAGNOSIS — Z419 Encounter for procedure for purposes other than remedying health state, unspecified: Secondary | ICD-10-CM | POA: Diagnosis not present

## 2024-09-22 ENCOUNTER — Ambulatory Visit
Admission: RE | Admit: 2024-09-22 | Discharge: 2024-09-22 | Disposition: A | Discharge status: Home / Self Care | Source: Ambulatory Visit | Attending: Nurse Practitioner | Admitting: Nurse Practitioner

## 2024-09-22 VITALS — BP 128/82 | HR 76 | Temp 98.1°F | Resp 17

## 2024-09-22 DIAGNOSIS — R1011 Right upper quadrant pain: Secondary | ICD-10-CM | POA: Insufficient documentation

## 2024-09-22 DIAGNOSIS — K8050 Calculus of bile duct without cholangitis or cholecystitis without obstruction: Secondary | ICD-10-CM | POA: Insufficient documentation

## 2024-09-22 LAB — POCT URINE DIPSTICK
Bilirubin, UA: NEGATIVE
Glucose, UA: NEGATIVE mg/dL
Ketones, POC UA: NEGATIVE mg/dL
Leukocytes, UA: NEGATIVE
Nitrite, UA: NEGATIVE
POC PROTEIN,UA: NEGATIVE
Spec Grav, UA: 1.015
Urobilinogen, UA: 0.2 U/dL
pH, UA: 7

## 2024-09-22 MED ORDER — KETOROLAC TROMETHAMINE 30 MG/ML IJ SOLN
60.0000 mg | Freq: Once | INTRAMUSCULAR | Status: AC
  Administered 2024-09-22: 60 mg via INTRAMUSCULAR

## 2024-09-22 MED ORDER — KETOROLAC TROMETHAMINE 10 MG PO TABS: 10.0000 mg | ORAL_TABLET | Freq: Four times a day (QID) | ORAL | 0 refills | Status: AC | PRN

## 2024-09-22 NOTE — Discharge Instructions (Addendum)
 You were seen today for upper abdominal pain that wraps around to the right side and lower back. Your symptoms are most consistent with biliary colic, which is gallbladder-related pain often triggered by eating. Your vital signs are stable, and there are no signs of infection at this time.  You were given medication today to help with pain. Additional testing has been ordered, including blood work and an ultrasound of your gallbladder. Please go directly to [facility] and inform them that you already have an order for an ultrasound. Do not check in as an ED patient.  For symptom control, take medications as prescribed. While on this medication, do not take other anti-inflammatory medications such as aspirin, Motrin, ibuprofen, naproxen, or Aleve. If additional pain relief is needed, you may take Tylenol  (acetaminophen ) 1000 mg every 6 hours, which is two 500 mg tablets at a time. Do not exceed 4000 mg of Tylenol  in 24 hours. Drink plenty of fluids and eat small meals that are low in fat and sugar. Avoid large meals and foods that are greasy, fried, or fatty, and pay attention to foods that worsen your symptoms.  Follow up with your primary care provider after testing is completed, as further evaluation or referral may be needed based on results. Go to the emergency department right away if your pain lasts longer than five hours, becomes severe or worsens, or if you develop fever, vomiting, yellowing of the skin or eyes, inability to keep food or fluids down, or any other concerning symptoms.

## 2024-09-22 NOTE — ED Provider Notes (Signed)
 " [facility]    CSN: [id number] Arrival date & time: 09/22/24  1645      History   Chief Complaint Chief Complaint  Patient presents with   Abdominal Pain    Stomack pain that goes to my lower back - Entered by patient    HPI Sharon Blair is a 59 y.o. female.   Discussed the use of AI scribe software for clinical note transcription with the patient, who gave verbal consent to proceed.   Sharon Blair presents with abdominal pain that began on Friday and has been ongoing for approximately three days. The pain had a gradual onset in the upper gastric region and wraps around to the right side, radiating into the right lower back. She describes the pain as sharp with a pressure-like sensation, currently rated 5-6/10 and reaching up to 8/10 at its worst. The pain is intermittent, occurring in episodes that last a couple of hours and remain constant during each episode.  She reports that the pain may be triggered by eating and is associated with a bloated sensation after meals, which has led her to limit oral intake due to concern that food may worsen her symptoms. She has a history of gastritis. She also notes increased urinary frequency with darker, stronger-smelling urine but denies dysuria or urinary discomfort. She reports constipation since Friday, with a normal bowel movement this morning.  Associated symptoms include fatigue and chills. She denies fever, nausea, vomiting, diarrhea, or black or bloody stools. She has tried chamomile tea for symptom relief. She is postmenopausal and denies any prior abdominal surgeries.  The following sections of the patient's history were reviewed and updated as appropriate: allergies, current medications, past family history, past medical history, past social history, past surgical history, and problem list.     Past Medical History:  Diagnosis Date   High cholesterol     Patient Active Problem List   Diagnosis Date Noted    Mixed hyperlipidemia 12/11/2016   Headache 06/18/2013   H/O vitamin D  deficiency 11/08/2012   Weight gain 11/08/2012   Hair loss 11/08/2012    History reviewed. No pertinent surgical history.  OB History     Gravida  6   Para  4   Term  4   Preterm      AB  1   Living  4      SAB  1   IAB      Ectopic      Multiple      Live Births  4            Home Medications    Prior to Admission medications  Medication Sig Start Date End Date Taking? Authorizing Provider  ketorolac  (TORADOL ) 10 MG tablet Take 1 tablet (10 mg total) by mouth every 6 (six) hours as needed (pain). Take with food to avoid stomach upset. Do not take any additional NSAIDs while on this. You may take tylenol  in addition to this if needed for extra pain relief. 09/22/24  Yes Iola Lukes, FNP  atorvastatin  (LIPITOR) 10 MG tablet Take 1 tablet (10 mg total) by mouth daily. 01/09/24   Saguier, [place], PA-C  diclofenac  Sodium (VOLTAREN ) 1 % GEL Apply 2 g topically 4 (four) times daily as needed. 06/16/24   Long, Fonda MATSU, MD  famotidine  (PEPCID ) 20 MG tablet Take 1 tablet (20 mg total) by mouth daily. 10/17/23   Saguier, [place], PA-C  ondansetron  (ZOFRAN -ODT) 4 MG disintegrating tablet Take 1  tablet (4 mg total) by mouth every 8 (eight) hours as needed for nausea or vomiting. 03/08/24   Mecum, Erin E, PA-C  triamcinolone  cream (KENALOG ) 0.1 % Apply 1 Application topically 2 (two) times daily. 03/08/24   Mecum, Erin E, PA-C  Vitamin D , Ergocalciferol , (DRISDOL ) 1.25 MG (50000 UNIT) CAPS capsule Take 1 capsule (50,000 Units total) by mouth every 7 (seven) days. 11/08/22   Saguier, [place], PA-C  Vitamin D , Ergocalciferol , (DRISDOL ) 1.25 MG (50000 UNIT) CAPS capsule Take 1 capsule (50,000 Units total) by mouth every 7 (seven) days. 01/09/24   Saguier, [place], PA-C    Family History Family History  Problem Relation Age of Onset   Hypertension Mother    Osteoporosis Mother    Diabetes Paternal Grandfather     Breast cancer Neg Hx     Social History Social History[1]   Allergies   Fosfomycin, Monurol [fosfomycin tromethamine ], and Prednisone    Review of Systems Review of Systems  Constitutional:  Positive for appetite change (tolerating food and liquids ok but tries not to eat as much in fear of developing pain), chills and fatigue. Negative for fever.  Gastrointestinal:  Positive for abdominal pain and constipation. Negative for abdominal distention (feels bloated), blood in stool, diarrhea, nausea and vomiting.  Genitourinary:  Positive for frequency. Negative for dysuria and menstrual problem (post-menopausal).       Odor to urine. Color of urine is not as light as it usually is.   Musculoskeletal:  Negative for myalgias.  All other systems reviewed and are negative.    Physical Exam Triage Vital Signs ED Triage Vitals  Encounter Vitals Group     BP 09/22/24 1732 128/82     Girls Systolic BP Percentile --      Girls Diastolic BP Percentile --      Boys Systolic BP Percentile --      Boys Diastolic BP Percentile --      Pulse Rate 09/22/24 1732 76     Resp 09/22/24 1732 17     Temp 09/22/24 1732 98.1 F (36.7 C)     Temp Source 09/22/24 1732 Oral     SpO2 09/22/24 1732 96 %     Weight --      Height --      Head Circumference --      Peak Flow --      Pain Score 09/22/24 1736 6     Pain Loc --      Pain Education --      Exclude from Growth Chart --    No data found.  Updated Vital Signs BP 128/82 (BP Location: Right Arm)   Pulse 76   Temp 98.1 F (36.7 C) (Oral)   Resp 17   LMP 11/10/2016   SpO2 96%   Visual Acuity Right Eye Distance:   Left Eye Distance:   Bilateral Distance:    Right Eye Near:   Left Eye Near:    Bilateral Near:     Physical Exam Vitals reviewed.  Constitutional:      General: She is awake. She is not in acute distress.    Appearance: Normal appearance. She is well-developed. She is not ill-appearing, toxic-appearing or  diaphoretic.  HENT:     Head: Normocephalic.     Right Ear: Hearing normal.     Left Ear: Hearing normal.     Nose: Nose normal.     Mouth/Throat:     Mouth: Mucous membranes are moist.  Eyes:  General: Vision grossly intact. No scleral icterus.    Conjunctiva/sclera: Conjunctivae normal.  Cardiovascular:     Rate and Rhythm: Normal rate and regular rhythm.     Heart sounds: Normal heart sounds.  Pulmonary:     Effort: Pulmonary effort is normal.     Breath sounds: Normal breath sounds and air entry.  Abdominal:     General: Bowel sounds are normal. There is no distension.     Palpations: Abdomen is soft.     Tenderness: There is generalized abdominal tenderness. There is no guarding or rebound.     Comments: Diffuse, generalized abdominal tenderness is noted without a focal point of maximal tenderness. There is no rebound tenderness, guarding, or CVA tenderness noted.    Musculoskeletal:        General: Normal range of motion.     Cervical back: Normal range of motion and neck supple.  Skin:    General: Skin is warm and dry.     Coloration: Skin is not jaundiced.  Neurological:     General: No focal deficit present.     Mental Status: She is alert and oriented to person, place, and time.  Psychiatric:        Speech: Speech normal.        Behavior: Behavior is cooperative.      UC Treatments / Results  Labs (all labs ordered are listed, but only abnormal results are displayed) Labs Reviewed  POCT URINE DIPSTICK - Abnormal; Notable for the following components:      Result Value   Color, UA light yellow (*)    Blood, UA trace-intact (*)    All other components within normal limits  URINE CULTURE  COMPREHENSIVE METABOLIC PANEL WITH GFR  CBC WITH DIFFERENTIAL/PLATELET  LIPASE    EKG   Radiology No results found.  Procedures Procedures (including critical care time)  Medications Ordered in UC Medications  ketorolac  (TORADOL ) 30 MG/ML injection 60 mg (60  mg Intramuscular Given 09/22/24 1852)    Initial Impression / Assessment and Plan / UC Course  I have reviewed the triage vital signs and the nursing notes.  Pertinent labs & imaging results that were available during my care of the patient were reviewed by me and considered in my medical decision making (see chart for details).    The patient presents with a three-day history of gradual-onset upper abdominal pain radiating to the right side and lower back, consistent with biliary colic. She describes the pain as sharp with a pressure-like sensation, constant with intermittent worsening, currently rated 5-6/10 with peaks up to 8/10, and lasting a couple of hours per episode. Symptoms are triggered after eating and associated with bloating. She reports increased urinary frequency with darker, stronger-smelling urine but denies dysuria. She denies fever, vomiting, diarrhea, hematuria, or blood in the stool. Constipation resolved earlier today. She is postmenopausal with a history of gastritis and is tolerating oral intake, though she has been limiting food due to symptom association. Vital signs are stable, she is afebrile, and urinalysis shows no evidence of infection.  Given the characteristic postprandial right upper quadrant pain with radiation and bloating, the presentation is most suggestive of gallbladder pathology, likely biliary colic from gallstones. Toradol  was administered in clinic for pain control. Further evaluation was initiated with outpatient laboratory studies including CBC, CMP, and lipase, as well as urine culture. An outpatient gallbladder ultrasound was ordered, and the patient was instructed to proceed directly to the [facility] emergency department  for completion of the ultrasound. Dietary counseling was provided, including avoidance of fatty, greasy, or fried foods and large meals, with recommendation for small, frequent meals low in fat, sugar, and simple carbohydrates  and increased fluid intake.  The patient will be notified of laboratory and ultrasound results when available. She was advised to follow up with her primary care provider for ongoing management and referral as indicated. She was instructed to seek immediate emergency care for pain lasting longer than five hours, worsening or severe pain, onset of fever, vomiting, inability to tolerate oral intake, jaundice, or any other concerning symptoms.  Today's evaluation has revealed no signs of a dangerous process. Discussed diagnosis with patient and/or guardian. Patient and/or guardian aware of their diagnosis, possible red flag symptoms to watch out for and need for close follow up. Patient and/or guardian understands verbal and written discharge instructions. Patient and/or guardian comfortable with plan and disposition.  Patient and/or guardian has a clear mental status at this time, good insight into illness (after discussion and teaching) and has clear judgment to make decisions regarding their care  Documentation was completed with the aid of voice recognition software. Transcription may contain typographical errors.  Final Clinical Impressions(s) / UC Diagnoses   Final diagnoses:  RUQ abdominal pain  Biliary colic     Discharge Instructions      You were seen today for upper abdominal pain that wraps around to the right side and lower back. Your symptoms are most consistent with biliary colic, which is gallbladder-related pain often triggered by eating. Your vital signs are stable, and there are no signs of infection at this time.  You were given medication today to help with pain. Additional testing has been ordered, including blood work and an ultrasound of your gallbladder. Please go directly to [facility] and inform them that you already have an order for an ultrasound. Do not check in as an ED patient.  For symptom control, take medications as prescribed.  While on this medication, do not take other anti-inflammatory medications such as aspirin, Motrin, ibuprofen, naproxen, or Aleve. If additional pain relief is needed, you may take Tylenol  (acetaminophen ) 1000 mg every 6 hours, which is two 500 mg tablets at a time. Do not exceed 4000 mg of Tylenol  in 24 hours. Drink plenty of fluids and eat small meals that are low in fat and sugar. Avoid large meals and foods that are greasy, fried, or fatty, and pay attention to foods that worsen your symptoms.  Follow up with your primary care provider after testing is completed, as further evaluation or referral may be needed based on results. Go to the emergency department right away if your pain lasts longer than five hours, becomes severe or worsens, or if you develop fever, vomiting, yellowing of the skin or eyes, inability to keep food or fluids down, or any other concerning symptoms.      ED Prescriptions     Medication Sig Dispense Auth. Provider   ketorolac  (TORADOL ) 10 MG tablet Take 1 tablet (10 mg total) by mouth every 6 (six) hours as needed (pain). Take with food to avoid stomach upset. Do not take any additional NSAIDs while on this. You may take tylenol  in addition to this if needed for extra pain relief. 20 tablet Iola Lukes, FNP      PDMP not reviewed this encounter.     [1]  Social History Tobacco Use   Smoking status: Never   Smokeless tobacco:  Never  Vaping Use   Vaping status: Never Used  Substance Use Topics   Alcohol use: Yes    Alcohol/week: 2.0 standard drinks of alcohol    Types: 2 Standard drinks or equivalent per week    Comment: SOCIAL. glasses of wine a month.   Drug use: No     Iola Lukes, [place] 09/22/24 1928  "

## 2024-09-22 NOTE — ED Triage Notes (Addendum)
 Pt c/o right upper abdominal pain and gas since Friday. Yesterday she began having pain radiating to right flank, frequent urination, and odd color urine.   Denies nausea, vomiting, or diarrhea

## 2024-09-23 ENCOUNTER — Ambulatory Visit (HOSPITAL_COMMUNITY): Payer: Self-pay

## 2024-09-23 LAB — CBC WITH DIFFERENTIAL/PLATELET
Basophils Absolute: 0 x10E3/uL (ref 0.0–0.2)
Basos: 1 %
EOS (ABSOLUTE): 0.1 x10E3/uL (ref 0.0–0.4)
Eos: 2 %
Hematocrit: 41.5 % (ref 34.0–46.6)
Hemoglobin: 13.9 g/dL (ref 11.1–15.9)
Immature Grans (Abs): 0 x10E3/uL (ref 0.0–0.1)
Immature Granulocytes: 0 %
Lymphocytes Absolute: 2.1 x10E3/uL (ref 0.7–3.1)
Lymphs: 36 %
MCH: 32.6 pg (ref 26.6–33.0)
MCHC: 33.5 g/dL (ref 31.5–35.7)
MCV: 97 fL (ref 79–97)
Monocytes Absolute: 0.5 x10E3/uL (ref 0.1–0.9)
Monocytes: 8 %
Neutrophils Absolute: 3.3 x10E3/uL (ref 1.4–7.0)
Neutrophils: 53 %
Platelets: 208 x10E3/uL (ref 150–450)
RBC: 4.27 x10E6/uL (ref 3.77–5.28)
RDW: 12.7 % (ref 11.7–15.4)
WBC: 6 x10E3/uL (ref 3.4–10.8)

## 2024-09-23 LAB — COMPREHENSIVE METABOLIC PANEL WITH GFR
ALT: 53 IU/L — ABNORMAL HIGH (ref 0–32)
AST: 55 IU/L — ABNORMAL HIGH (ref 0–40)
Albumin: 4.2 g/dL (ref 3.8–4.9)
Alkaline Phosphatase: 87 IU/L (ref 49–135)
BUN/Creatinine Ratio: 20 (ref 9–23)
BUN: 10 mg/dL (ref 6–24)
Bilirubin Total: 0.6 mg/dL (ref 0.0–1.2)
CO2: 24 mmol/L (ref 20–29)
Calcium: 8.8 mg/dL (ref 8.7–10.2)
Chloride: 103 mmol/L (ref 96–106)
Creatinine, Ser: 0.51 mg/dL — ABNORMAL LOW (ref 0.57–1.00)
Globulin, Total: 3.2 g/dL (ref 1.5–4.5)
Glucose: 92 mg/dL (ref 70–99)
Potassium: 3.7 mmol/L (ref 3.5–5.2)
Sodium: 140 mmol/L (ref 134–144)
Total Protein: 7.4 g/dL (ref 6.0–8.5)
eGFR: 109 mL/min/1.73

## 2024-09-23 LAB — URINE CULTURE

## 2024-09-23 LAB — LIPASE: Lipase: 32 U/L (ref 14–72)

## 2024-09-25 ENCOUNTER — Other Ambulatory Visit: Payer: Self-pay

## 2024-09-25 ENCOUNTER — Emergency Department (HOSPITAL_BASED_OUTPATIENT_CLINIC_OR_DEPARTMENT_OTHER)

## 2024-09-25 ENCOUNTER — Emergency Department (HOSPITAL_BASED_OUTPATIENT_CLINIC_OR_DEPARTMENT_OTHER)
Admission: EM | Admit: 2024-09-25 | Discharge: 2024-09-25 | Disposition: A | Discharge status: Home / Self Care | Attending: Emergency Medicine | Admitting: Emergency Medicine

## 2024-09-25 ENCOUNTER — Encounter (HOSPITAL_BASED_OUTPATIENT_CLINIC_OR_DEPARTMENT_OTHER): Payer: Self-pay

## 2024-09-25 DIAGNOSIS — R1011 Right upper quadrant pain: Secondary | ICD-10-CM | POA: Diagnosis present

## 2024-09-25 DIAGNOSIS — K760 Fatty (change of) liver, not elsewhere classified: Secondary | ICD-10-CM | POA: Diagnosis not present

## 2024-09-25 LAB — URINALYSIS, ROUTINE W REFLEX MICROSCOPIC
Bilirubin Urine: NEGATIVE
Glucose, UA: NEGATIVE mg/dL
Hgb urine dipstick: NEGATIVE
Ketones, ur: NEGATIVE mg/dL
Leukocytes,Ua: NEGATIVE
Nitrite: NEGATIVE
Protein, ur: NEGATIVE mg/dL
Specific Gravity, Urine: 1.01 (ref 1.005–1.030)
pH: 6.5 (ref 5.0–8.0)

## 2024-09-25 LAB — CBC
HCT: 40.2 % (ref 36.0–46.0)
Hemoglobin: 13.9 g/dL (ref 12.0–15.0)
MCH: 32.9 pg (ref 26.0–34.0)
MCHC: 34.6 g/dL (ref 30.0–36.0)
MCV: 95 fL (ref 80.0–100.0)
Platelets: 200 K/uL (ref 150–400)
RBC: 4.23 MIL/uL (ref 3.87–5.11)
RDW: 12.2 % (ref 11.5–15.5)
WBC: 4.8 K/uL (ref 4.0–10.5)
nRBC: 0 % (ref 0.0–0.2)

## 2024-09-25 LAB — COMPREHENSIVE METABOLIC PANEL WITH GFR
ALT: 62 U/L — ABNORMAL HIGH (ref 0–44)
AST: 59 U/L — ABNORMAL HIGH (ref 15–41)
Albumin: 4.6 g/dL (ref 3.5–5.0)
Alkaline Phosphatase: 85 U/L (ref 38–126)
Anion gap: 10 (ref 5–15)
BUN: 9 mg/dL (ref 6–20)
CO2: 26 mmol/L (ref 22–32)
Calcium: 9.8 mg/dL (ref 8.9–10.3)
Chloride: 103 mmol/L (ref 98–111)
Creatinine, Ser: 0.58 mg/dL (ref 0.44–1.00)
GFR, Estimated: >60 mL/min
Glucose, Bld: 102 mg/dL — ABNORMAL HIGH (ref 70–99)
Potassium: 3.6 mmol/L (ref 3.5–5.1)
Sodium: 140 mmol/L (ref 135–145)
Total Bilirubin: 1 mg/dL (ref 0.0–1.2)
Total Protein: 7.9 g/dL (ref 6.5–8.1)

## 2024-09-25 LAB — LIPASE, BLOOD: Lipase: 31 U/L (ref 11–51)

## 2024-09-25 MED ORDER — IOHEXOL 300 MG/ML  SOLN
100.0000 mL | Freq: Once | INTRAMUSCULAR | Status: AC | PRN
  Administered 2024-09-25: 100 mL via INTRAVENOUS

## 2024-09-25 MED ORDER — PANTOPRAZOLE SODIUM 20 MG PO TBEC: 20.0000 mg | DELAYED_RELEASE_TABLET | Freq: Every day | ORAL | 0 refills | Status: AC

## 2024-09-25 MED ORDER — SODIUM CHLORIDE 0.9 % IV BOLUS
1000.0000 mL | Freq: Once | INTRAVENOUS | Status: AC
  Administered 2024-09-25: 1000 mL via INTRAVENOUS

## 2024-09-25 MED ORDER — ALUM & MAG HYDROXIDE-SIMETH 200-200-20 MG/5ML PO SUSP
30.0000 mL | Freq: Once | ORAL | Status: AC
  Administered 2024-09-25: 30 mL via ORAL
  Filled 2024-09-25: qty 30

## 2024-09-25 MED ORDER — ONDANSETRON 4 MG PO TBDP: 4.0000 mg | ORAL_TABLET | Freq: Three times a day (TID) | ORAL | 0 refills | Status: AC | PRN

## 2024-09-25 MED ORDER — HYDROCODONE-ACETAMINOPHEN 5-325 MG PO TABS: 1.0000 | ORAL_TABLET | ORAL | 0 refills | Status: AC | PRN

## 2024-09-25 MED ORDER — PANTOPRAZOLE SODIUM 40 MG IV SOLR
40.0000 mg | Freq: Once | INTRAVENOUS | Status: AC
  Administered 2024-09-25: 40 mg via INTRAVENOUS
  Filled 2024-09-25: qty 10

## 2024-09-25 MED ORDER — ONDANSETRON HCL 4 MG/2ML IJ SOLN
4.0000 mg | Freq: Once | INTRAMUSCULAR | Status: AC
  Administered 2024-09-25: 4 mg via INTRAVENOUS
  Filled 2024-09-25: qty 2

## 2024-09-25 NOTE — ED Notes (Signed)

## 2024-09-25 NOTE — ED Triage Notes (Signed)
 Pt reports pain in her stomach since Friday. Pt states that she was seen at [facility] on Monday and they gave her meds but they are not working. Pt states pain is on the right side of her stomach and radiates to the center of her stomach.

## 2024-09-25 NOTE — Discharge Instructions (Addendum)
 Stop the Toradol !

## 2024-09-25 NOTE — ED Provider Notes (Signed)
 " [facility] Provider Note   CSN: [id number] Arrival date & time: 09/25/24  9083     Patient presents with: Abdominal Pain   Sharon Blair is a 59 y.o. female.   Pt is a 59 yo female with pmhx significant for hld and obesity.  Pt has been having RUQ pain and nausea since Friday, 1/9.  She went to UC on 1/12 and was given toradol  there and was given a rx for toradol .  Pain is still there.  She is scheduled for an US  on Saturday the 17th, but could not wait that long.  She was up all last night with the pain.  Labs were done on the 12th which did not show anything acute.       Prior to Admission medications  Medication Sig Start Date End Date Taking? Authorizing Provider  HYDROcodone -acetaminophen  (NORCO/VICODIN) 5-325 MG tablet Take 1 tablet by mouth every 4 (four) hours as needed. 09/25/24  Yes Dean Clarity, MD  ondansetron  (ZOFRAN -ODT) 4 MG disintegrating tablet Take 1 tablet (4 mg total) by mouth every 8 (eight) hours as needed. 09/25/24  Yes Dean Clarity, MD  pantoprazole  (PROTONIX ) 20 MG tablet Take 1 tablet (20 mg total) by mouth daily. 09/25/24  Yes Dean Clarity, MD  atorvastatin  (LIPITOR) 10 MG tablet Take 1 tablet (10 mg total) by mouth daily. 01/09/24   Saguier, [place], PA-C  diclofenac  Sodium (VOLTAREN ) 1 % GEL Apply 2 g topically 4 (four) times daily as needed. 06/16/24   Long, Fonda MATSU, MD  famotidine  (PEPCID ) 20 MG tablet Take 1 tablet (20 mg total) by mouth daily. 10/17/23   Saguier, [place], PA-C  ketorolac  (TORADOL ) 10 MG tablet Take 1 tablet (10 mg total) by mouth every 6 (six) hours as needed (pain). Take with food to avoid stomach upset. Do not take any additional NSAIDs while on this. You may take tylenol  in addition to this if needed for extra pain relief. 09/22/24   Iola Lukes, FNP  ondansetron  (ZOFRAN -ODT) 4 MG disintegrating tablet Take 1 tablet (4 mg total) by mouth every 8 (eight) hours as needed for  nausea or vomiting. 03/08/24   Mecum, Erin E, PA-C  triamcinolone  cream (KENALOG ) 0.1 % Apply 1 Application topically 2 (two) times daily. 03/08/24   Mecum, Erin E, PA-C  Vitamin D , Ergocalciferol , (DRISDOL ) 1.25 MG (50000 UNIT) CAPS capsule Take 1 capsule (50,000 Units total) by mouth every 7 (seven) days. 11/08/22   Saguier, [place], PA-C  Vitamin D , Ergocalciferol , (DRISDOL ) 1.25 MG (50000 UNIT) CAPS capsule Take 1 capsule (50,000 Units total) by mouth every 7 (seven) days. 01/09/24   Saguier, [place], PA-C    Allergies: Fosfomycin, Monurol [fosfomycin tromethamine ], and Prednisone     Review of Systems  Gastrointestinal:  Positive for abdominal pain and nausea.  All other systems reviewed and are negative.   Updated Vital Signs BP 138/74   Pulse (!) 56   Temp 97.9 F (36.6 C) (Oral)   Resp 16   Ht 4' 9 (1.448 m)   Wt 86.2 kg   LMP 11/10/2016   SpO2 100%   BMI 41.12 kg/m   Physical Exam Vitals and nursing note reviewed.  Constitutional:      Appearance: She is well-developed.  HENT:     Head: Normocephalic and atraumatic.     Mouth/Throat:     Mouth: Mucous membranes are moist.     Pharynx: Oropharynx is clear.  Eyes:     Extraocular Movements:  Extraocular movements intact.     Pupils: Pupils are equal, round, and reactive to light.  Cardiovascular:     Rate and Rhythm: Normal rate and regular rhythm.  Abdominal:     General: Bowel sounds are normal.     Palpations: Abdomen is soft.     Tenderness: There is abdominal tenderness in the right upper quadrant, right lower quadrant and epigastric area.  Skin:    General: Skin is warm.     Capillary Refill: Capillary refill takes less than 2 seconds.  Neurological:     General: No focal deficit present.     Mental Status: She is alert and oriented to person, place, and time.  Psychiatric:        Mood and Affect: Mood normal.        Behavior: Behavior normal.     (all labs ordered are listed, but only abnormal results  are displayed) Labs Reviewed  COMPREHENSIVE METABOLIC PANEL WITH GFR - Abnormal; Notable for the following components:      Result Value   Glucose, Bld 102 (*)    AST 59 (*)    ALT 62 (*)    All other components within normal limits  LIPASE, BLOOD  CBC  URINALYSIS, ROUTINE W REFLEX MICROSCOPIC    EKG: None  Radiology: CT ABDOMEN PELVIS W CONTRAST Result Date: 09/25/2024 EXAM: CT ABDOMEN AND PELVIS WITH CONTRAST 09/25/2024 11:47:00 AM TECHNIQUE: CT of the abdomen and pelvis was performed with the administration of 100 mL iohexol  (OMNIPAQUE ) 300 MG/ML solution. Multiplanar reformatted images are provided for review. Automated exposure control, iterative reconstruction, and/or weight-based adjustment of the mA/kV was utilized to reduce the radiation dose to as low as reasonably achievable. COMPARISON: 09/25/2024, 11/13/2023. CLINICAL HISTORY: Abdominal pain, acute, nonlocalized. FINDINGS: LOWER CHEST: No acute abnormality. LIVER: Hepatic steatosis. GALLBLADDER AND BILE DUCTS: Gallbladder is unremarkable. No biliary ductal dilatation. SPLEEN: No acute abnormality. PANCREAS: No acute abnormality. ADRENAL GLANDS: No acute abnormality. KIDNEYS, URETERS AND BLADDER: No stones in the kidneys or ureters. No hydronephrosis. No perinephric or periureteral stranding. The urinary bladder is distended without focal abnormality. GI AND BOWEL: Stomach demonstrates no acute abnormality. There is no bowel obstruction. Normal appendix. PERITONEUM AND RETROPERITONEUM: No ascites. No free air. No free pelvic fluid. VASCULATURE: Aorta is normal in caliber. Scattered aortoiliac atherosclerosis. LYMPH NODES: No lymphadenopathy. REPRODUCTIVE ORGANS: The uterus and ovaries are within normal limits for patients age. BONES AND SOFT TISSUES: Degenerative disc disease at L5-S1. Multilevel thoracolumbar osteophytosis. No focal soft tissue abnormality. IMPRESSION: 1. No acute findings in the abdomen or pelvis. 2. Hepatic  steatosis. Electronically signed by: Rogelia Myers MD 09/25/2024 12:29 PM EST RP Workstation: [id number]   US  Abdomen Limited RUQ (LIVER/GB) Result Date: 09/25/2024 EXAM: Right Upper Quadrant Abdominal Ultrasound 09/25/2024 10:22:41 AM TECHNIQUE: Real-time ultrasonography of the right upper quadrant of the abdomen was performed. COMPARISON: US  Abdomen 11/13/2023. CLINICAL HISTORY: RUQ pain. FINDINGS: LIVER: Increased echogenicity of the liver, consistent with hepatic steatosis. No intrahepatic biliary ductal dilatation. No evidence of mass. Hepatopetal flow in the portal vein. BILIARY SYSTEM: Gallbladder wall thickness measures 2.0 mm. No pericholecystic fluid. No cholelithiasis. Negative sonographic Murphy's sign. Common bile duct measures 4.0 mm. OTHER: No right upper quadrant ascites. IMPRESSION: 1. No cholecystolithiasis or sonographic evidence of acute cholecystitis. 2. Hepatic steatosis. Electronically signed by: Rogelia Myers MD 09/25/2024 10:40 AM EST RP Workstation: [id number]     Procedures   Medications Ordered in the ED  sodium chloride  0.9 % bolus 1,000 mL (  1,000 mLs Intravenous New Bag/Given 09/25/24 1104)  ondansetron  (ZOFRAN ) injection 4 mg (4 mg Intravenous Given 09/25/24 1058)  alum & mag hydroxide-simeth (MAALOX/MYLANTA) 200-200-20 MG/5ML suspension 30 mL (30 mLs Oral Given 09/25/24 1057)  pantoprazole  (PROTONIX ) injection 40 mg (40 mg Intravenous Given 09/25/24 1059)  iohexol  (OMNIPAQUE ) 300 MG/ML solution 100 mL (100 mLs Intravenous Contrast Given 09/25/24 1133)                                    Medical Decision Making Amount and/or Complexity of Data Reviewed Labs: ordered. Radiology: ordered.  Risk OTC drugs. Prescription drug management.   This patient presents to the ED for concern of abd pain, this involves an extensive number of treatment options, and is a complaint that carries with it a high risk of complications and morbidity.  The differential diagnosis  includes gastritis, pud, cholecystitis, pancreatitis, appy   Co morbidities that complicate the patient evaluation  Hld and obesity   Additional history obtained:  Additional history obtained from epic chart review   Lab Tests:  I Ordered, and personally interpreted labs.  The pertinent results include:  cbc nl, cmp nl other than chronic, mild elevation of LFTs; lip nl; ua neg   Imaging Studies ordered:  I ordered imaging studies including RUQ US  and CT abd/pelvis I independently visualized and interpreted imaging which showed  US : No cholecystolithiasis or sonographic evidence of acute cholecystitis.  2. Hepatic steatosis.  CT abd/pelvis: No acute findings in the abdomen or pelvis.  2. Hepatic steatosis.   I agree with the radiologist interpretation  Medicines ordered and prescription drug management:  I ordered medication including ivfs/zofran /gi cocktail/protonix   for sx  Reevaluation of the patient after these medicines showed that the patient improved I have reviewed the patients home medicines and have made adjustments as needed   Test Considered:  ct   Problem List / ED Course:  Abd pain:  us  and ct neg other than fatty liver which is likely chronic.  Pt describes pain as burning.  She may have gastritis or is developing an ulcer.  She is started on protonix .  She is also told to stop the toradol  prescribed to her on 1/12.  Pt is referred to GI.  Return if worse.   Reevaluation:  After the interventions noted above, I reevaluated the patient and found that they have :improved   Social Determinants of Health:  Lives at home   Dispostion:  After consideration of the diagnostic results and the patients response to treatment, I feel that the patent would benefit from discharge with outpatient f/u.       Final diagnoses:  Right upper quadrant abdominal pain  Fatty liver    ED Discharge Orders          Ordered    Ambulatory referral to  Gastroenterology        09/25/24 1253    pantoprazole  (PROTONIX ) 20 MG tablet  Daily        09/25/24 1254    ondansetron  (ZOFRAN -ODT) 4 MG disintegrating tablet  Every 8 hours PRN        09/25/24 1254    HYDROcodone -acetaminophen  (NORCO/VICODIN) 5-325 MG tablet  Every 4 hours PRN        09/25/24 1254               Auset Fritzler, MD 09/25/24 1258  "

## 2024-09-27 ENCOUNTER — Ambulatory Visit (HOSPITAL_BASED_OUTPATIENT_CLINIC_OR_DEPARTMENT_OTHER)

## 2025-01-09 ENCOUNTER — Encounter: Admitting: Medical
